# Patient Record
Sex: Female | Born: 1970 | Race: White | Hispanic: No | Marital: Married | Smoking: Never smoker
Health system: Southern US, Community
[De-identification: ages and names within clinical notes are randomized; demographics above are authoritative.]

## PROBLEM LIST (undated history)

## (undated) DIAGNOSIS — N809 Endometriosis, unspecified: Secondary | ICD-10-CM

## (undated) DIAGNOSIS — J349 Unspecified disorder of nose and nasal sinuses: Secondary | ICD-10-CM

## (undated) DIAGNOSIS — T7840XA Allergy, unspecified, initial encounter: Secondary | ICD-10-CM

## (undated) DIAGNOSIS — K219 Gastro-esophageal reflux disease without esophagitis: Secondary | ICD-10-CM

## (undated) DIAGNOSIS — I10 Essential (primary) hypertension: Secondary | ICD-10-CM

## (undated) HISTORY — PX: HAMMER TOE SURGERY: SHX385

## (undated) HISTORY — PX: GASTRECTOMY: SHX58

## (undated) HISTORY — DX: Essential (primary) hypertension: I10

## (undated) HISTORY — PX: TUBAL LIGATION: SHX77

## (undated) HISTORY — PX: ABLATION: SHX5711

## (undated) HISTORY — DX: Allergy, unspecified, initial encounter: T78.40XA

## (undated) HISTORY — PX: ORIF ANKLE FRACTURE: SHX5408

## (undated) HISTORY — PX: ANKLE FRACTURE SURGERY: SHX122

## (undated) HISTORY — PX: SINUS EXPLORATION: SHX5214

## (undated) HISTORY — PX: FOOT SURGERY: SHX648

---

## 1998-05-31 ENCOUNTER — Other Ambulatory Visit: Admission: RE | Admit: 1998-05-31 | Discharge: 1998-05-31 | Payer: Self-pay | Admitting: *Deleted

## 1999-01-24 ENCOUNTER — Inpatient Hospital Stay (HOSPITAL_COMMUNITY): Admission: AD | Admit: 1999-01-24 | Discharge: 1999-01-24 | Payer: Self-pay | Admitting: *Deleted

## 1999-02-02 ENCOUNTER — Inpatient Hospital Stay (HOSPITAL_COMMUNITY): Admission: AD | Admit: 1999-02-02 | Discharge: 1999-02-02 | Payer: Self-pay | Admitting: Obstetrics and Gynecology

## 1999-02-11 ENCOUNTER — Inpatient Hospital Stay (HOSPITAL_COMMUNITY): Admission: AD | Admit: 1999-02-11 | Discharge: 1999-02-13 | Payer: Self-pay | Admitting: Obstetrics & Gynecology

## 1999-03-19 ENCOUNTER — Other Ambulatory Visit: Admission: RE | Admit: 1999-03-19 | Discharge: 1999-03-19 | Payer: Self-pay | Admitting: *Deleted

## 2001-08-05 ENCOUNTER — Inpatient Hospital Stay (HOSPITAL_COMMUNITY): Admission: AD | Admit: 2001-08-05 | Discharge: 2001-08-05 | Payer: Self-pay | Admitting: Obstetrics and Gynecology

## 2001-08-12 ENCOUNTER — Inpatient Hospital Stay (HOSPITAL_COMMUNITY): Admission: AD | Admit: 2001-08-12 | Discharge: 2001-08-12 | Payer: Self-pay | Admitting: Obstetrics and Gynecology

## 2001-08-17 ENCOUNTER — Inpatient Hospital Stay (HOSPITAL_COMMUNITY): Admission: AD | Admit: 2001-08-17 | Discharge: 2001-08-17 | Payer: Self-pay | Admitting: Obstetrics & Gynecology

## 2001-08-17 ENCOUNTER — Encounter: Payer: Self-pay | Admitting: Obstetrics & Gynecology

## 2001-11-04 ENCOUNTER — Inpatient Hospital Stay (HOSPITAL_COMMUNITY): Admission: AD | Admit: 2001-11-04 | Discharge: 2001-11-07 | Payer: Self-pay | Admitting: *Deleted

## 2003-02-14 ENCOUNTER — Encounter: Admission: RE | Admit: 2003-02-14 | Discharge: 2003-02-28 | Payer: Self-pay | Admitting: Family Medicine

## 2003-09-08 ENCOUNTER — Encounter: Payer: Self-pay | Admitting: Emergency Medicine

## 2003-09-08 ENCOUNTER — Emergency Department (HOSPITAL_COMMUNITY): Admission: EM | Admit: 2003-09-08 | Discharge: 2003-09-08 | Payer: Self-pay | Admitting: Emergency Medicine

## 2004-01-02 ENCOUNTER — Other Ambulatory Visit: Admission: RE | Admit: 2004-01-02 | Discharge: 2004-01-02 | Payer: Self-pay | Admitting: *Deleted

## 2005-02-07 ENCOUNTER — Other Ambulatory Visit: Admission: RE | Admit: 2005-02-07 | Discharge: 2005-02-07 | Payer: Self-pay | Admitting: Obstetrics and Gynecology

## 2005-05-07 ENCOUNTER — Ambulatory Visit (HOSPITAL_COMMUNITY): Admission: RE | Admit: 2005-05-07 | Discharge: 2005-05-07 | Payer: Self-pay | Admitting: Obstetrics and Gynecology

## 2012-01-21 ENCOUNTER — Other Ambulatory Visit: Payer: Self-pay | Admitting: Otolaryngology

## 2012-01-21 DIAGNOSIS — J339 Nasal polyp, unspecified: Secondary | ICD-10-CM

## 2012-01-21 DIAGNOSIS — J328 Other chronic sinusitis: Secondary | ICD-10-CM

## 2012-01-23 ENCOUNTER — Other Ambulatory Visit: Payer: Self-pay

## 2012-01-27 ENCOUNTER — Ambulatory Visit
Admission: RE | Admit: 2012-01-27 | Discharge: 2012-01-27 | Disposition: A | Discharge status: Home / Self Care | Payer: BC Managed Care – PPO | Source: Ambulatory Visit | Attending: Otolaryngology | Admitting: Otolaryngology

## 2012-01-27 DIAGNOSIS — J328 Other chronic sinusitis: Secondary | ICD-10-CM

## 2012-01-27 DIAGNOSIS — J339 Nasal polyp, unspecified: Secondary | ICD-10-CM

## 2012-02-03 ENCOUNTER — Other Ambulatory Visit: Payer: Self-pay | Admitting: Otolaryngology

## 2012-10-19 ENCOUNTER — Encounter (HOSPITAL_COMMUNITY): Payer: Self-pay | Admitting: Emergency Medicine

## 2012-10-19 ENCOUNTER — Emergency Department (HOSPITAL_COMMUNITY)
Admission: EM | Admit: 2012-10-19 | Discharge: 2012-10-19 | Disposition: A | Discharge status: Home / Self Care | Payer: BC Managed Care – PPO | Attending: Emergency Medicine | Admitting: Emergency Medicine

## 2012-10-19 ENCOUNTER — Emergency Department (HOSPITAL_COMMUNITY): Payer: BC Managed Care – PPO

## 2012-10-19 DIAGNOSIS — Z3202 Encounter for pregnancy test, result negative: Secondary | ICD-10-CM | POA: Insufficient documentation

## 2012-10-19 DIAGNOSIS — Z8669 Personal history of other diseases of the nervous system and sense organs: Secondary | ICD-10-CM | POA: Insufficient documentation

## 2012-10-19 DIAGNOSIS — R319 Hematuria, unspecified: Secondary | ICD-10-CM | POA: Insufficient documentation

## 2012-10-19 DIAGNOSIS — N898 Other specified noninflammatory disorders of vagina: Secondary | ICD-10-CM | POA: Insufficient documentation

## 2012-10-19 DIAGNOSIS — R6883 Chills (without fever): Secondary | ICD-10-CM | POA: Insufficient documentation

## 2012-10-19 DIAGNOSIS — K5732 Diverticulitis of large intestine without perforation or abscess without bleeding: Secondary | ICD-10-CM

## 2012-10-19 DIAGNOSIS — Z79899 Other long term (current) drug therapy: Secondary | ICD-10-CM | POA: Insufficient documentation

## 2012-10-19 HISTORY — DX: Unspecified disorder of nose and nasal sinuses: J34.9

## 2012-10-19 HISTORY — DX: Endometriosis, unspecified: N80.9

## 2012-10-19 LAB — URINE MICROSCOPIC-ADD ON

## 2012-10-19 LAB — CBC WITH DIFFERENTIAL/PLATELET
Basophils Absolute: 0 10*3/uL (ref 0.0–0.1)
Basophils Relative: 0 % (ref 0–1)
HCT: 39.2 % (ref 36.0–46.0)
Lymphocytes Relative: 19 % (ref 12–46)
MCHC: 35.5 g/dL (ref 30.0–36.0)
Monocytes Absolute: 1 10*3/uL (ref 0.1–1.0)
Neutro Abs: 9.2 10*3/uL — ABNORMAL HIGH (ref 1.7–7.7)
Neutrophils Relative %: 69 % (ref 43–77)
Platelets: 288 10*3/uL (ref 150–400)
RDW: 13.5 % (ref 11.5–15.5)
WBC: 13.3 10*3/uL — ABNORMAL HIGH (ref 4.0–10.5)

## 2012-10-19 LAB — COMPREHENSIVE METABOLIC PANEL
ALT: 25 U/L (ref 0–35)
AST: 19 U/L (ref 0–37)
Albumin: 3.9 g/dL (ref 3.5–5.2)
CO2: 24 mEq/L (ref 19–32)
Chloride: 101 mEq/L (ref 96–112)
Creatinine, Ser: 0.77 mg/dL (ref 0.50–1.10)
Sodium: 136 mEq/L (ref 135–145)
Total Bilirubin: 1.3 mg/dL — ABNORMAL HIGH (ref 0.3–1.2)

## 2012-10-19 LAB — URINALYSIS, ROUTINE W REFLEX MICROSCOPIC
Hgb urine dipstick: NEGATIVE
Nitrite: NEGATIVE
Protein, ur: 30 mg/dL — AB
Specific Gravity, Urine: 1.023 (ref 1.005–1.030)
Urobilinogen, UA: 0.2 mg/dL (ref 0.0–1.0)

## 2012-10-19 LAB — PREGNANCY, URINE: Preg Test, Ur: NEGATIVE

## 2012-10-19 LAB — WET PREP, GENITAL: Trich, Wet Prep: NONE SEEN

## 2012-10-19 MED ORDER — MORPHINE SULFATE 4 MG/ML IJ SOLN
4.0000 mg | Freq: Once | INTRAMUSCULAR | Status: AC
  Administered 2012-10-19: 4 mg via INTRAVENOUS
  Filled 2012-10-19: qty 1

## 2012-10-19 MED ORDER — IOHEXOL 300 MG/ML  SOLN: 80.0000 mL | Freq: Once | INTRAMUSCULAR | Status: DC | PRN

## 2012-10-19 MED ORDER — ONDANSETRON HCL 4 MG/2ML IJ SOLN
4.0000 mg | Freq: Once | INTRAMUSCULAR | Status: AC
  Administered 2012-10-19: 4 mg via INTRAVENOUS
  Filled 2012-10-19: qty 2

## 2012-10-19 MED ORDER — ONDANSETRON 8 MG PO TBDP: 8.0000 mg | ORAL_TABLET | Freq: Three times a day (TID) | ORAL | Status: DC | PRN

## 2012-10-19 MED ORDER — CIPROFLOXACIN HCL 500 MG PO TABS: 500.0000 mg | ORAL_TABLET | Freq: Two times a day (BID) | ORAL | Status: DC

## 2012-10-19 MED ORDER — OXYCODONE-ACETAMINOPHEN 5-325 MG PO TABS
1.0000 | ORAL_TABLET | Freq: Once | ORAL | Status: AC
  Administered 2012-10-19: 1 via ORAL
  Filled 2012-10-19: qty 1

## 2012-10-19 MED ORDER — MORPHINE SULFATE 4 MG/ML IJ SOLN
6.0000 mg | Freq: Once | INTRAMUSCULAR | Status: AC
  Administered 2012-10-19: 6 mg via INTRAVENOUS
  Filled 2012-10-19: qty 2

## 2012-10-19 MED ORDER — METRONIDAZOLE 500 MG PO TABS: 500.0000 mg | ORAL_TABLET | Freq: Three times a day (TID) | ORAL | Status: DC

## 2012-10-19 MED ORDER — IOHEXOL 300 MG/ML  SOLN
20.0000 mL | INTRAMUSCULAR | Status: AC
  Administered 2012-10-19 (×2): 20 mL via ORAL

## 2012-10-19 MED ORDER — HYDROCODONE-ACETAMINOPHEN 5-500 MG PO TABS: 1.0000 | ORAL_TABLET | Freq: Four times a day (QID) | ORAL | Status: DC | PRN

## 2012-10-19 NOTE — ED Notes (Signed)
Ptt finished drinking contrast, CT notified.

## 2012-10-19 NOTE — ED Notes (Signed)
Lower abd pain hurts to touch it since yesterday denies dysuria no n/v d

## 2012-10-19 NOTE — ED Notes (Signed)
Pt c/o lower abd pain that started yesterday and has gotten progressively worse. Pt denies dysuria. Pt reports the pain feels like contractions. Pt LMP 10/02/12.

## 2012-10-19 NOTE — ED Provider Notes (Signed)
History     CSN: [id number]  Arrival date & time 10/19/12  1149   First MD Initiated Contact with Patient 10/19/12 1158      No chief complaint on file.   (Consider location/radiation/quality/duration/timing/severity/associated sxs/prior treatment) HPI Elizabeth Waller is a 41 y.o. female who presents with lower abdominal pain. States pain started last night. States constant, sharp. States no nausea, vomiting, urinary symptoms, diarrhea. Last normal bowel movement yesterday. States urine is red in color. Denies back or flank pain. Denies fever, states had chills last night. No hx of the same. Denies taking any medications prior to arrival.    Past Medical History  Diagnosis Date  . Sinus trouble     No past surgical history on file.  No family history on file.  History  Substance Use Topics  . Smoking status: Never Smoker   . Smokeless tobacco: Not on file  . Alcohol Use: No    OB History    Grav Para Term Preterm Abortions TAB SAB Ect Mult Living                  Review of Systems  Constitutional: Positive for chills. Negative for fever.  Respiratory: Negative.   Cardiovascular: Negative.   Gastrointestinal: Positive for abdominal pain. Negative for nausea, vomiting, diarrhea, constipation and rectal pain.  Genitourinary: Positive for hematuria. Negative for dysuria, urgency, vaginal bleeding, vaginal discharge, difficulty urinating and menstrual problem.  Neurological: Negative for dizziness, weakness and headaches.  All other systems reviewed and are negative.    Allergies  Review of patient's allergies indicates no known allergies.  Home Medications   Current Outpatient Rx  Name  Route  Sig  Dispense  Refill  . ATENOLOL 100 MG PO TABS   Oral   Take 100 mg by mouth daily.         Marland Kitchen OVER THE COUNTER MEDICATION   Oral   Take 1 tablet by mouth daily. Takes [employer] brand of allergy medication         . TRIAMTERENE-HCTZ 37.5-25 MG PO TABS   Oral  Take 1 tablet by mouth daily.           BP 129/61  Pulse 97  Temp 98.2 F (36.8 C) (Oral)  Resp 18  SpO2 99%  Physical Exam  Nursing note and vitals reviewed. Constitutional: She appears well-developed and well-nourished. No distress.  Neck: Neck supple.  Cardiovascular: Normal rate, regular rhythm and normal heart sounds.   Pulmonary/Chest: Effort normal and breath sounds normal. No respiratory distress. She has no wheezes. She has no rales.  Abdominal: Soft. Bowel sounds are normal. She exhibits no distension. There is tenderness. There is no rebound.       Diffuse tenderness, worse in the suprapubic area. No guarding. No CVA tendereness  Genitourinary:       Normal external genitalia. White vaginal discharge. No CMT. Suprapubic tenderness. No adnexal tenderness  Musculoskeletal: She exhibits no edema.  Neurological: She is alert.  Skin: Skin is warm and dry.  Psychiatric: She has a normal mood and affect. Her behavior is normal.    ED Course  Procedures (including critical care time)  PT with lower abdominal pain. No hx of the same. Will get labs, urine.   Results for orders placed during the hospital encounter of 10/19/12  URINALYSIS, ROUTINE W REFLEX MICROSCOPIC      Component Value Range   Color, Urine AMBER (*) YELLOW   APPearance CLOUDY (*) CLEAR   Specific  Gravity, Urine 1.023  1.005 - 1.030   pH 6.0  5.0 - 8.0   Glucose, UA NEGATIVE  NEGATIVE mg/dL   Hgb urine dipstick NEGATIVE  NEGATIVE   Bilirubin Urine SMALL (*) NEGATIVE   Ketones, ur 15 (*) NEGATIVE mg/dL   Protein, ur 30 (*) NEGATIVE mg/dL   Urobilinogen, UA 0.2  0.0 - 1.0 mg/dL   Nitrite NEGATIVE  NEGATIVE   Leukocytes, UA SMALL (*) NEGATIVE  PREGNANCY, URINE      Component Value Range   Preg Test, Ur NEGATIVE  NEGATIVE  CBC WITH DIFFERENTIAL      Component Value Range   WBC 13.3 (*) 4.0 - 10.5 K/uL   RBC 4.66  3.87 - 5.11 MIL/uL   Hemoglobin 13.9  12.0 - 15.0 g/dL   HCT 30.8  65.7 - 84.6 %    MCV 84.1  78.0 - 100.0 fL   MCH 29.8  26.0 - 34.0 pg   MCHC 35.5  30.0 - 36.0 g/dL   RDW 96.2  95.2 - 84.1 %   Platelets 288  150 - 400 K/uL   Neutrophils Relative 69  43 - 77 %   Neutro Abs 9.2 (*) 1.7 - 7.7 K/uL   Lymphocytes Relative 19  12 - 46 %   Lymphs Abs 2.5  0.7 - 4.0 K/uL   Monocytes Relative 7  3 - 12 %   Monocytes Absolute 1.0  0.1 - 1.0 K/uL   Eosinophils Relative 5  0 - 5 %   Eosinophils Absolute 0.6  0.0 - 0.7 K/uL   Basophils Relative 0  0 - 1 %   Basophils Absolute 0.0  0.0 - 0.1 K/uL  COMPREHENSIVE METABOLIC PANEL      Component Value Range   Sodium 136  135 - 145 mEq/L   Potassium 3.2 (*) 3.5 - 5.1 mEq/L   Chloride 101  96 - 112 mEq/L   CO2 24  19 - 32 mEq/L   Glucose, Bld 104 (*) 70 - 99 mg/dL   BUN 11  6 - 23 mg/dL   Creatinine, Ser 3.24  0.50 - 1.10 mg/dL   Calcium 9.2  8.4 - 40.1 mg/dL   Total Protein 7.2  6.0 - 8.3 g/dL   Albumin 3.9  3.5 - 5.2 g/dL   AST 19  0 - 37 U/L   ALT 25  0 - 35 U/L   Alkaline Phosphatase 52  39 - 117 U/L   Total Bilirubin 1.3 (*) 0.3 - 1.2 mg/dL   GFR calc non Af Amer >90  >90 mL/min   GFR calc Af Amer >90  >90 mL/min  LIPASE, BLOOD      Component Value Range   Lipase 22  11 - 59 U/L  URINE MICROSCOPIC-ADD ON      Component Value Range   Squamous Epithelial / LPF FEW (*) RARE   WBC, UA 3-6  <3 WBC/hpf   RBC / HPF 0-2  <3 RBC/hpf   Bacteria, UA RARE  RARE    Will get CT, suspect possible colitis/diverticulitis, elevated WBC, tenderness.   Ct Abdomen Pelvis W Contrast  10/19/2012  *RADIOLOGY REPORT*  Clinical Data: Low abdominal pain.  History of tubal ligation.  CT ABDOMEN AND PELVIS WITH CONTRAST  Technique:  Multidetector CT imaging of the abdomen and pelvis was performed following the standard protocol during bolus administration of intravenous contrast.  Contrast:  80 ml Omnipaque-300 intravenously.  Comparison: Report from abdominal ultrasound 08/17/2001.  Findings:  There are several large peripherally calcified  gallstones within the gallbladder lumen which contain central nitrogen.  There is no gallbladder wall thickening or biliary dilatation.  There is hepatic low density consistent with steatosis.  No focal abnormalities of the liver or pancreas are seen.  The spleen, adrenal glands and kidneys appear unremarkable.  There is sigmoid colon wall thickening with mild inflammatory change in the adjacent fat.  There are sigmoid colon diverticular changes.  There is no focal extraluminal fluid or air collection. A small amount of free pelvic fluid is within physiologic limits. The appendix appears normal.  There is a small exophytic uterine fundal fibroid.  No adnexal masses are seen.  The urinary bladder appears normal.  There are no acute or significant osseous findings.  IMPRESSION:  1.  Findings are most consistent with mild sigmoid diverticulitis. No evidence of perforation or abscess.  Clinical follow-up recommended. 2.  No evidence of appendicitis or bowel obstruction. 3.  Cholelithiasis without evidence of cholecystitis or biliary dilatation. 4.  Mild hepatic steatosis. 5.  Small uterine fundal fibroid.   Original Report Authenticated By: Carey Bullocks, M.D.       1. Sigmoid diverticulitis       MDM  Pt with lower abdominal pain for two days, nausea. Labs show elevated wbc of 13.3, otherwise unremarkable. Pain only in lower abdomen. UA negative for infection, small bili noted.  Lipase, LFTs negative, mildly elevated bili of 1.3. CT obtained which showed mild sigmoid diverticulitis with no evidence of perforation of abscess. Pt is non toxic, afebrile, vs normal. Not vomiting. Suspect pt is appropriate for outpatient treatment. Will stat on antibiotics, cipro and flaglyl, pain meds, follow up closely with primary are doctor.         Lottie Mussel, [place] 10/20/12 [phone]

## 2012-10-20 LAB — GC/CHLAMYDIA PROBE AMP
CT Probe RNA: NEGATIVE
GC Probe RNA: NEGATIVE

## 2012-10-20 NOTE — ED Provider Notes (Signed)
Medical screening examination/treatment/procedure(s) were performed by non-physician practitioner and as supervising physician I was immediately available for consultation/collaboration.   Elizabeth Waller. Oletta Lamas, MD 10/20/12 1610

## 2013-03-29 ENCOUNTER — Other Ambulatory Visit: Payer: Self-pay | Admitting: Obstetrics & Gynecology

## 2013-09-20 IMAGING — CT CT ABD-PELV W/ CM
2 of 5 series · 16 of 46 positions shown, 18 images · IV contrast (agent unspecified)
Comparison: Report from abdominal ultrasound 08/17/2001.

CLINICAL DATA: Low abdominal pain.  History of tubal ligation.

CT ABDOMEN AND PELVIS WITH CONTRAST
TECHNIQUE: Multidetector CT imaging of the abdomen and pelvis was
performed following the standard protocol during bolus
administration of intravenous contrast.
Contrast:  80 ml Emnipaque-YAA intravenously.

[Series 2: abd/pelv with 5.0 b31f st · axial · 0.81mm/px · z∈[-525,-115]mm · 13 of 92 slices shown, 15 images]
[im 5/92  soft-tissue]
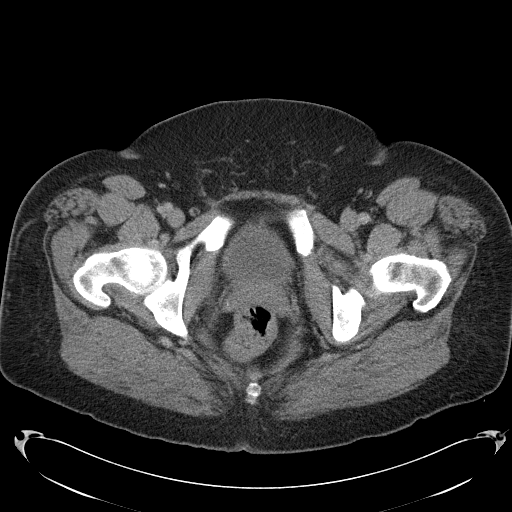
[im 5/92  bone]
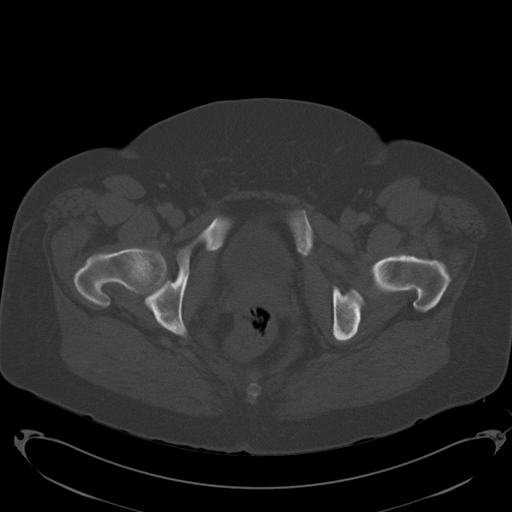
[im 15/92  soft-tissue]
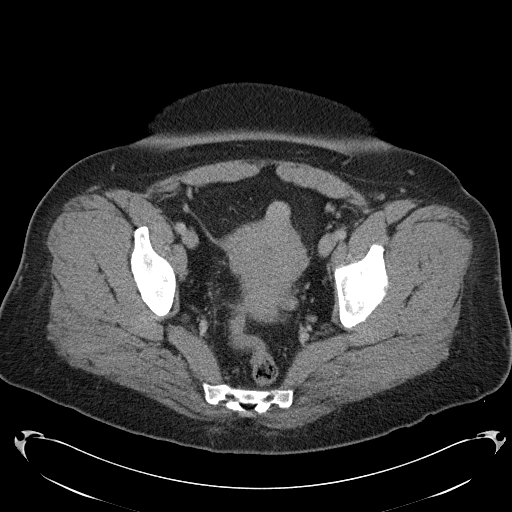
[im 20/92  soft-tissue]
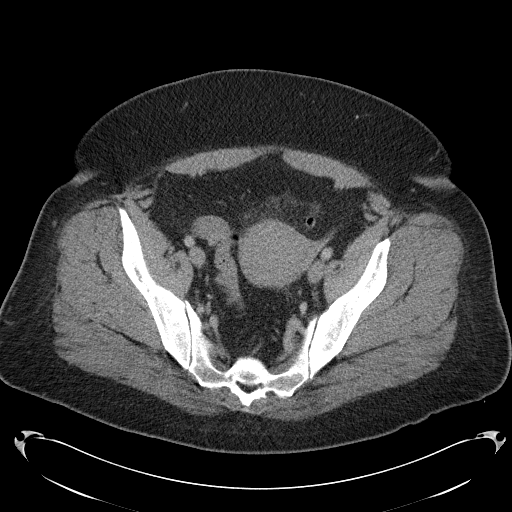
[im 24/92  soft-tissue]
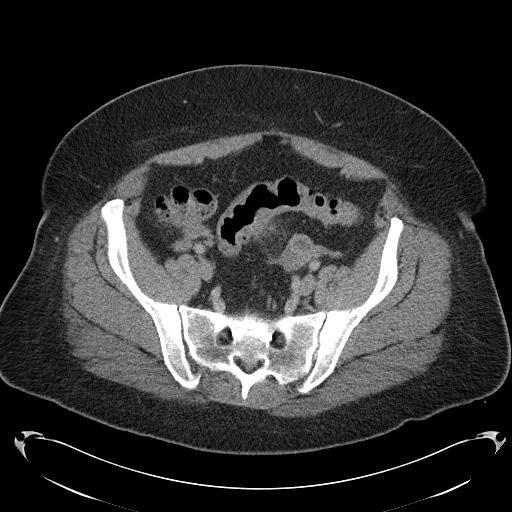
[im 34/92  soft-tissue]
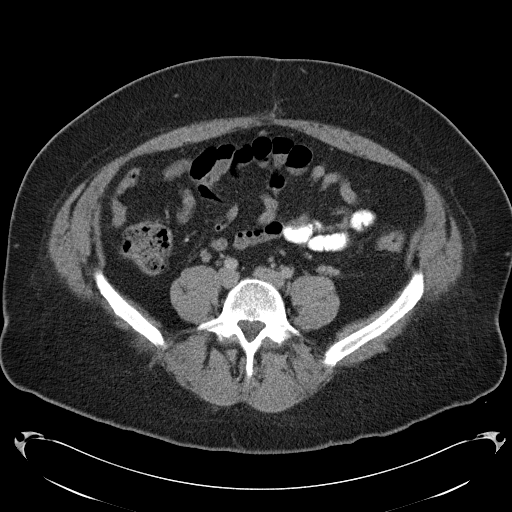
[im 39/92  soft-tissue]
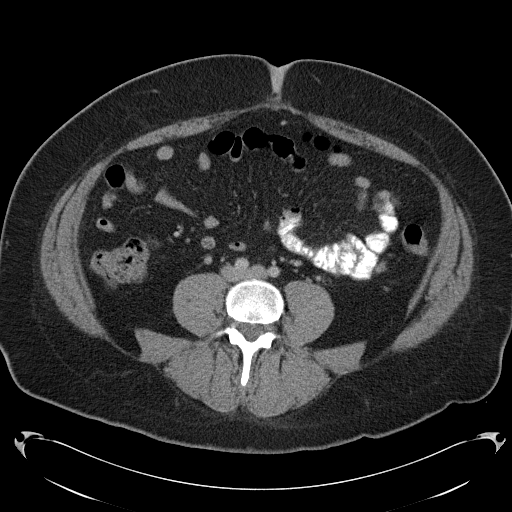
[im 48/92  soft-tissue]
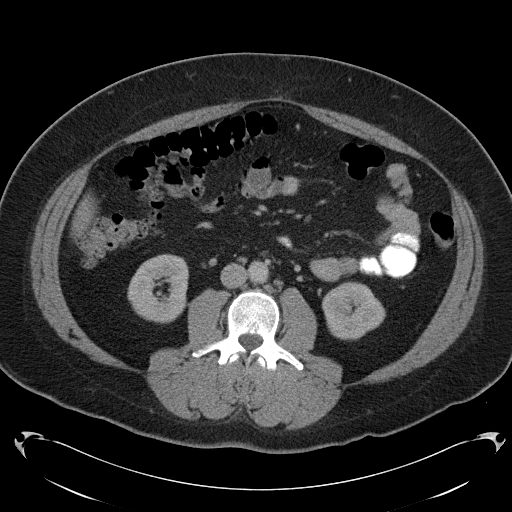
[im 53/92  soft-tissue]
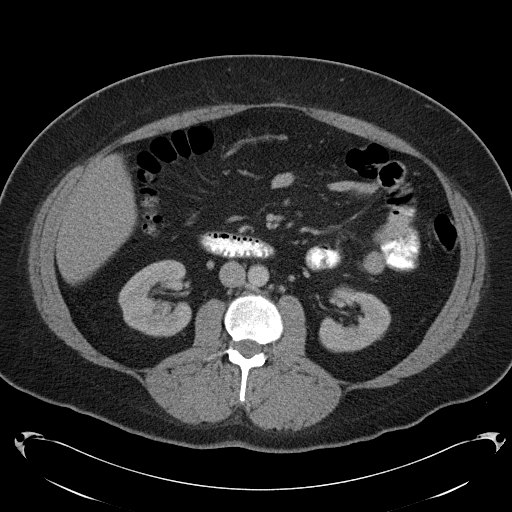
[im 58/92  soft-tissue]
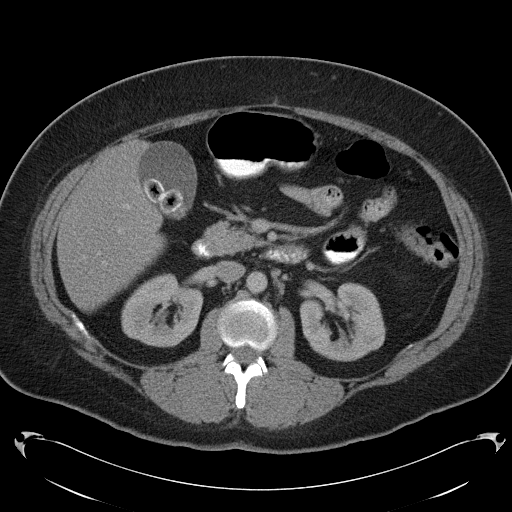
[im 58/92  bone]
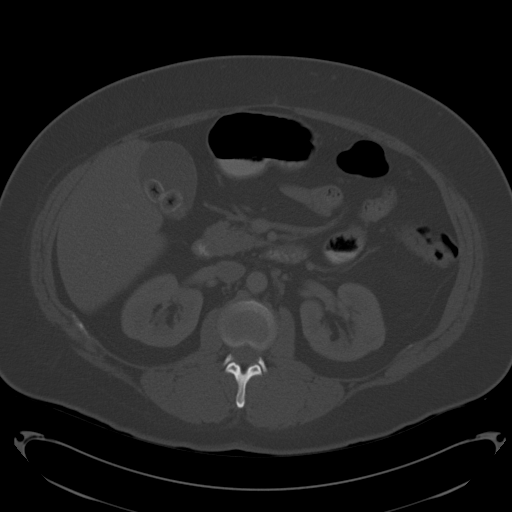
[im 68/92  soft-tissue]
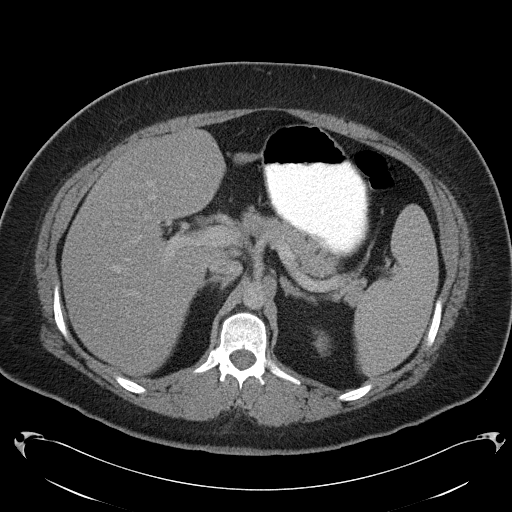
[im 72/92  soft-tissue]
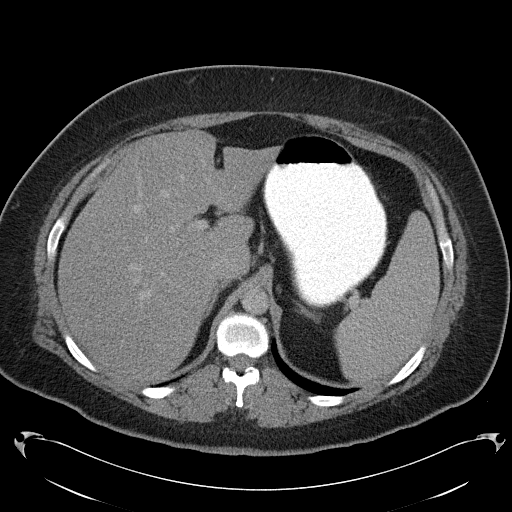
[im 77/92  soft-tissue]
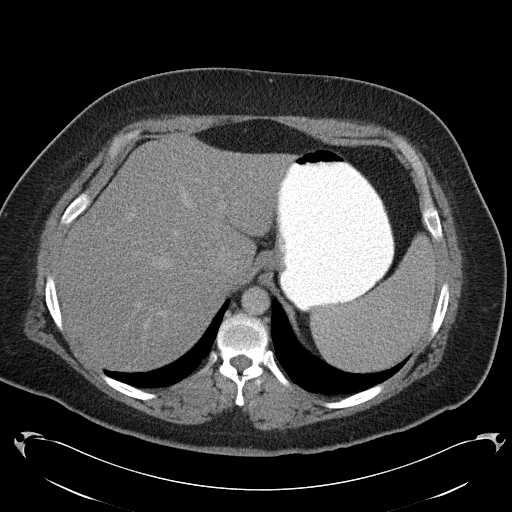
[im 87/92  soft-tissue]
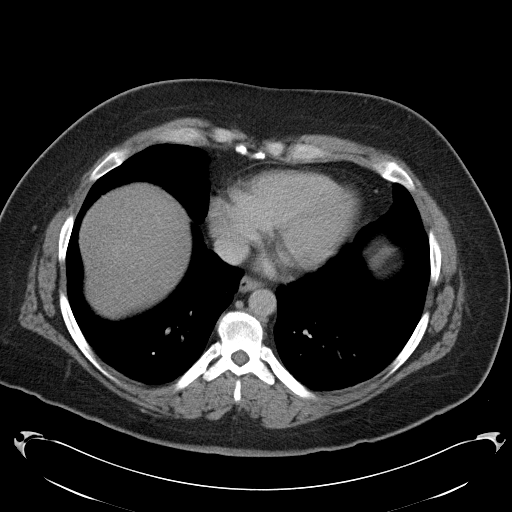

[Series 5: coronals · coronal · 0.81mm/px · 3 of 135 slices shown]
[im 45/135  soft-tissue]
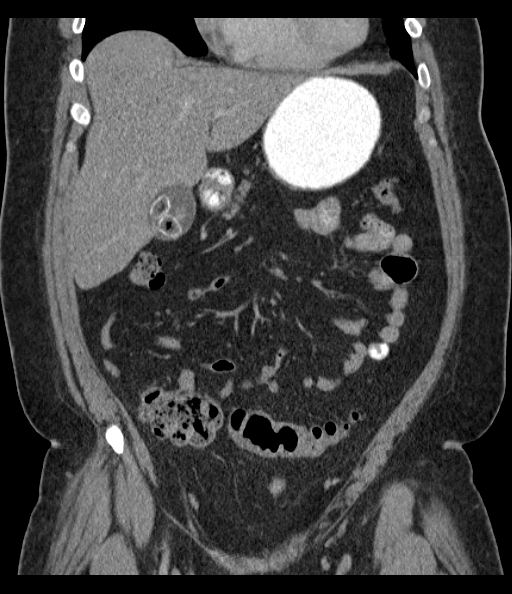
[im 60/135  soft-tissue]
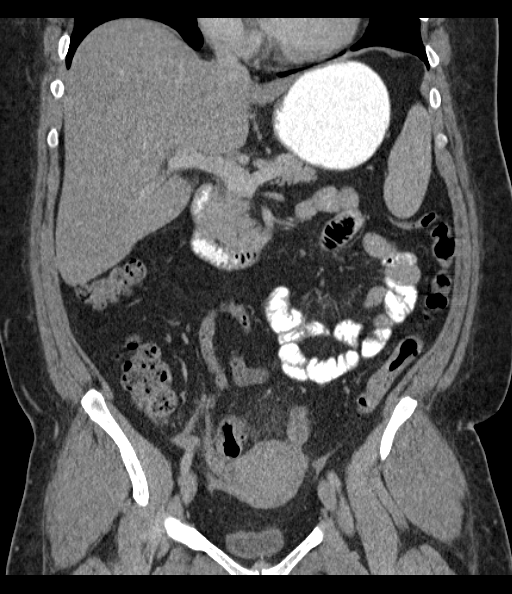
[im 75/135  soft-tissue]
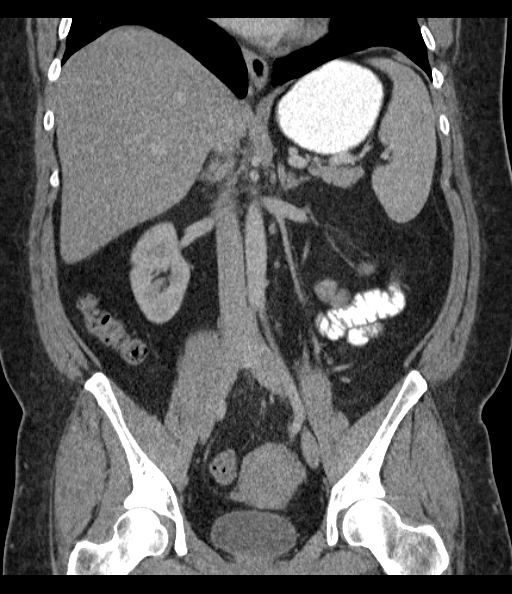

[16 of 46 positions shown; findings below may reference images not displayed]

FINDINGS: There are several large peripherally calcified gallstones
within the gallbladder lumen which contain central nitrogen.  There
is no gallbladder wall thickening or biliary dilatation.  There is
hepatic low density consistent with steatosis.  No focal
abnormalities of the liver or pancreas are seen.

The spleen, adrenal glands and kidneys appear unremarkable.

There is sigmoid colon wall thickening with mild inflammatory
change in the adjacent fat.  There are sigmoid colon diverticular
changes.  There is no focal extraluminal fluid or air collection. A
small amount of free pelvic fluid is within physiologic limits.
The appendix appears normal.  There is a small exophytic uterine
fundal fibroid.  No adnexal masses are seen.  The urinary bladder
appears normal.

There are no acute or significant osseous findings.
IMPRESSION: 1.  Findings are most consistent with mild sigmoid diverticulitis.
No evidence of perforation or abscess.  Clinical follow-up
recommended.
2.  No evidence of appendicitis or bowel obstruction.
3.  Cholelithiasis without evidence of cholecystitis or biliary
dilatation.
4.  Mild hepatic steatosis.
5.  Small uterine fundal fibroid.

## 2015-03-26 ENCOUNTER — Other Ambulatory Visit: Payer: Self-pay | Admitting: Obstetrics & Gynecology

## 2015-03-27 LAB — CYTOLOGY - PAP

## 2015-11-01 DIAGNOSIS — R0981 Nasal congestion: Secondary | ICD-10-CM | POA: Insufficient documentation

## 2015-11-01 DIAGNOSIS — J0101 Acute recurrent maxillary sinusitis: Secondary | ICD-10-CM | POA: Insufficient documentation

## 2015-11-01 DIAGNOSIS — J320 Chronic maxillary sinusitis: Secondary | ICD-10-CM | POA: Insufficient documentation

## 2015-11-01 DIAGNOSIS — R040 Epistaxis: Secondary | ICD-10-CM | POA: Insufficient documentation

## 2015-11-01 DIAGNOSIS — J339 Nasal polyp, unspecified: Secondary | ICD-10-CM | POA: Insufficient documentation

## 2015-12-17 DIAGNOSIS — E785 Hyperlipidemia, unspecified: Secondary | ICD-10-CM | POA: Insufficient documentation

## 2015-12-17 DIAGNOSIS — J342 Deviated nasal septum: Secondary | ICD-10-CM | POA: Insufficient documentation

## 2015-12-17 DIAGNOSIS — M545 Low back pain, unspecified: Secondary | ICD-10-CM | POA: Insufficient documentation

## 2015-12-17 DIAGNOSIS — J343 Hypertrophy of nasal turbinates: Secondary | ICD-10-CM | POA: Insufficient documentation

## 2015-12-17 DIAGNOSIS — I10 Essential (primary) hypertension: Secondary | ICD-10-CM | POA: Insufficient documentation

## 2015-12-17 DIAGNOSIS — E876 Hypokalemia: Secondary | ICD-10-CM | POA: Insufficient documentation

## 2015-12-17 DIAGNOSIS — I341 Nonrheumatic mitral (valve) prolapse: Secondary | ICD-10-CM | POA: Insufficient documentation

## 2016-01-24 DIAGNOSIS — E669 Obesity, unspecified: Secondary | ICD-10-CM | POA: Insufficient documentation

## 2016-02-04 DIAGNOSIS — Z0000 Encounter for general adult medical examination without abnormal findings: Secondary | ICD-10-CM | POA: Insufficient documentation

## 2016-04-30 DIAGNOSIS — Z903 Acquired absence of stomach [part of]: Secondary | ICD-10-CM | POA: Insufficient documentation

## 2016-09-25 ENCOUNTER — Emergency Department (HOSPITAL_COMMUNITY)
Admission: EM | Admit: 2016-09-25 | Discharge: 2016-09-25 | Disposition: A | Discharge status: Other Acute Inpatient Hospital | Payer: BLUE CROSS/BLUE SHIELD | Attending: Emergency Medicine | Admitting: Emergency Medicine

## 2016-09-25 ENCOUNTER — Encounter (HOSPITAL_COMMUNITY): Payer: Self-pay | Admitting: Emergency Medicine

## 2016-09-25 ENCOUNTER — Inpatient Hospital Stay (HOSPITAL_COMMUNITY)
Admission: AD | Admit: 2016-09-25 | Discharge: 2016-09-28 | DRG: 882 | Disposition: A | Discharge status: Home / Self Care | Payer: BLUE CROSS/BLUE SHIELD | Source: Intra-hospital | Attending: Psychiatry | Admitting: Psychiatry

## 2016-09-25 DIAGNOSIS — F329 Major depressive disorder, single episode, unspecified: Secondary | ICD-10-CM | POA: Insufficient documentation

## 2016-09-25 DIAGNOSIS — F419 Anxiety disorder, unspecified: Secondary | ICD-10-CM | POA: Diagnosis present

## 2016-09-25 DIAGNOSIS — F4323 Adjustment disorder with mixed anxiety and depressed mood: Secondary | ICD-10-CM | POA: Clinically undetermined

## 2016-09-25 DIAGNOSIS — Z79899 Other long term (current) drug therapy: Secondary | ICD-10-CM | POA: Insufficient documentation

## 2016-09-25 DIAGNOSIS — Z9884 Bariatric surgery status: Secondary | ICD-10-CM | POA: Diagnosis not present

## 2016-09-25 DIAGNOSIS — F322 Major depressive disorder, single episode, severe without psychotic features: Secondary | ICD-10-CM | POA: Diagnosis present

## 2016-09-25 DIAGNOSIS — K219 Gastro-esophageal reflux disease without esophagitis: Secondary | ICD-10-CM | POA: Diagnosis present

## 2016-09-25 DIAGNOSIS — Z9889 Other specified postprocedural states: Secondary | ICD-10-CM | POA: Diagnosis not present

## 2016-09-25 DIAGNOSIS — R45851 Suicidal ideations: Secondary | ICD-10-CM | POA: Diagnosis present

## 2016-09-25 DIAGNOSIS — Z23 Encounter for immunization: Secondary | ICD-10-CM

## 2016-09-25 HISTORY — DX: Gastro-esophageal reflux disease without esophagitis: K21.9

## 2016-09-25 LAB — RAPID URINE DRUG SCREEN, HOSP PERFORMED
AMPHETAMINES: NOT DETECTED
BENZODIAZEPINES: NOT DETECTED
Barbiturates: NOT DETECTED
Cocaine: NOT DETECTED
OPIATES: NOT DETECTED
TETRAHYDROCANNABINOL: NOT DETECTED

## 2016-09-25 LAB — COMPREHENSIVE METABOLIC PANEL
ALBUMIN: 4.3 g/dL (ref 3.5–5.0)
ALK PHOS: 45 U/L (ref 38–126)
ALT: 15 U/L (ref 14–54)
AST: 16 U/L (ref 15–41)
Anion gap: 8 (ref 5–15)
BUN: 12 mg/dL (ref 6–20)
CHLORIDE: 105 mmol/L (ref 101–111)
CO2: 27 mmol/L (ref 22–32)
CREATININE: 0.74 mg/dL (ref 0.44–1.00)
Calcium: 9.4 mg/dL (ref 8.9–10.3)
Glucose, Bld: 96 mg/dL (ref 65–99)
POTASSIUM: 3.8 mmol/L (ref 3.5–5.1)
Sodium: 140 mmol/L (ref 135–145)
Total Bilirubin: 1 mg/dL (ref 0.3–1.2)
Total Protein: 6.7 g/dL (ref 6.5–8.1)

## 2016-09-25 LAB — CBC
HCT: 39.8 % (ref 36.0–46.0)
HEMOGLOBIN: 13.5 g/dL (ref 12.0–15.0)
MCH: 29 pg (ref 26.0–34.0)
MCHC: 33.9 g/dL (ref 30.0–36.0)
MCV: 85.6 fL (ref 78.0–100.0)
PLATELETS: 311 10*3/uL (ref 150–400)
RBC: 4.65 MIL/uL (ref 3.87–5.11)
RDW: 13.2 % (ref 11.5–15.5)
WBC: 7.2 10*3/uL (ref 4.0–10.5)

## 2016-09-25 LAB — ACETAMINOPHEN LEVEL: Acetaminophen (Tylenol), Serum: <10 ug/mL — ABNORMAL LOW (ref 10–30)

## 2016-09-25 LAB — PREGNANCY, URINE: Preg Test, Ur: NEGATIVE

## 2016-09-25 LAB — ETHANOL

## 2016-09-25 LAB — SALICYLATE LEVEL

## 2016-09-25 MED ORDER — ACETAMINOPHEN 325 MG PO TABS
650.0000 mg | ORAL_TABLET | Freq: Four times a day (QID) | ORAL | Status: DC | PRN
  Administered 2016-09-25: 650 mg via ORAL
  Filled 2016-09-25: qty 2

## 2016-09-25 MED ORDER — ALUM & MAG HYDROXIDE-SIMETH 200-200-20 MG/5ML PO SUSP: 30.0000 mL | ORAL | Status: DC | PRN

## 2016-09-25 MED ORDER — MAGNESIUM HYDROXIDE 400 MG/5ML PO SUSP: 30.0000 mL | Freq: Every day | ORAL | Status: DC | PRN

## 2016-09-25 MED ORDER — ACETAMINOPHEN 325 MG PO TABS: 650.0000 mg | ORAL_TABLET | Freq: Four times a day (QID) | ORAL | Status: DC | PRN

## 2016-09-25 MED ORDER — HYDROXYZINE HCL 25 MG PO TABS: 25.0000 mg | ORAL_TABLET | Freq: Four times a day (QID) | ORAL | Status: DC | PRN

## 2016-09-25 MED ORDER — TRAZODONE HCL 50 MG PO TABS: 50.0000 mg | ORAL_TABLET | Freq: Every evening | ORAL | Status: DC | PRN

## 2016-09-25 NOTE — ED Notes (Signed)
Prior to signing consent form. Pt request if she no longer wants to go voluntarily will she be allowed to leave. RN informed the pt that she was unaware and would have to go talk to the doctor. Pt did not ask any follow up questions. Pt signed request on her on free will.

## 2016-09-25 NOTE — ED Notes (Signed)
Pt's daughter took all of pt's belongings home with her except glasses.   Pt moved to POD C room 21. Pt signed Welcome to POD C sheet given to tech and sitter at transport.

## 2016-09-25 NOTE — ED Provider Notes (Signed)
[facility]-EMERGENCY DEPT Provider Note   CSN: [id number] Arrival date & time: 09/25/16  1328     History   Chief Complaint Chief Complaint  Patient presents with  . Suicidal    HPI Elizabeth Waller is a 45 y.o. female.  Patient presents to the ED with a chief complaint of suicidal ideation.  She is brought in voluntarily by her daughter.  Patient states that she has many life stressors that have led her to feel this way.  She states that she wants to jump off the parking structure at her job and would want to overdose on medication at home.  She states that she is being bullied by her supervisor.  She states that if she is going to leave this life she might "just take my family with me."  She denies taking any medications today apart from her regular home meds.  She denies any drug or alcohol use.  She denies any physical pain or symptoms with the exception of mild headache.   The history is provided by the patient. No language interpreter was used.    Past Medical History:  Diagnosis Date  . Endometriosis   . Sinus trouble     There are no active problems to display for this patient.   Past Surgical History:  Procedure Laterality Date  . ABLATION     for scar tissue and endometriosis  . ANKLE FRACTURE SURGERY    . CESAREAN SECTION    . GASTRECTOMY    . SINUS EXPLORATION      OB History    No data available       Home Medications    Prior to Admission medications   Medication Sig Start Date End Date Taking? Authorizing Provider  atenolol (TENORMIN) 100 MG tablet Take 100 mg by mouth daily.    Historical Provider, MD  ciprofloxacin (CIPRO) 500 MG tablet Take 1 tablet (500 mg total) by mouth 2 (two) times daily. 10/19/12   Tatyana Kirichenko, PA-C  HYDROcodone-acetaminophen (VICODIN) 5-500 MG per tablet Take 1-2 tablets by mouth every 6 (six) hours as needed for pain. 10/19/12   Tatyana Kirichenko, PA-C  metroNIDAZOLE (FLAGYL) 500 MG tablet Take 1 tablet (500 mg  total) by mouth 3 (three) times daily. 10/19/12   Tatyana Kirichenko, PA-C  ondansetron (ZOFRAN ODT) 8 MG disintegrating tablet Take 1 tablet (8 mg total) by mouth every 8 (eight) hours as needed for nausea. 10/19/12   Tatyana Kirichenko, PA-C  OVER THE COUNTER MEDICATION Take 1 tablet by mouth daily. Takes [employer] brand of allergy medication    Historical Provider, MD  triamterene-hydrochlorothiazide (MAXZIDE-25) 37.5-25 MG per tablet Take 1 tablet by mouth daily.    Historical Provider, MD    Family History History reviewed. No pertinent family history.  Social History Social History  Substance Use Topics  . Smoking status: Never Smoker  . Smokeless tobacco: Not on file  . Alcohol use No     Allergies   Review of patient's allergies indicates no known allergies.   Review of Systems Review of Systems  Psychiatric/Behavioral: Positive for suicidal ideas.       Homicidal thoughts  All other systems reviewed and are negative.    Physical Exam Updated Vital Signs BP 143/95 (BP Location: Right Arm)   Pulse 72   Temp 97.9 F (36.6 C) (Oral)   Resp 19   Ht 5\' 7"  (1.702 m)   Wt 79.4 kg   SpO2 96%   BMI 27.41 kg/m  Physical Exam  Constitutional: She is oriented to person, place, and time. She appears well-developed and well-nourished.  HENT:  Head: Normocephalic and atraumatic.  Eyes: Conjunctivae and EOM are normal. Pupils are equal, round, and reactive to light.  Neck: Normal range of motion. Neck supple.  Cardiovascular: Normal rate and regular rhythm.  Exam reveals no gallop and no friction rub.   No murmur heard. Pulmonary/Chest: Effort normal and breath sounds normal. No respiratory distress. She has no wheezes. She has no rales. She exhibits no tenderness.  Abdominal: Soft. Bowel sounds are normal. She exhibits no distension and no mass. There is no tenderness. There is no rebound and no guarding.  Musculoskeletal: Normal range of motion. She exhibits no edema or  tenderness.  Neurological: She is alert and oriented to person, place, and time.  Skin: Skin is warm and dry.  Psychiatric:  Tearful Depressed   Nursing note and vitals reviewed.    ED Treatments / Results  Labs (all labs ordered are listed, but only abnormal results are displayed) Labs Reviewed  ACETAMINOPHEN LEVEL - Abnormal; Notable for the following:       Result Value   Acetaminophen (Tylenol), Serum <10 (*)    All other components within normal limits  COMPREHENSIVE METABOLIC PANEL  ETHANOL  SALICYLATE LEVEL  CBC  RAPID URINE DRUG SCREEN, HOSP PERFORMED    EKG  EKG Interpretation None       Radiology No results found.  Procedures Procedures (including critical care time)  Medications Ordered in ED Medications  acetaminophen (TYLENOL) tablet 650 mg (not administered)     Initial Impression / Assessment and Plan / ED Course  I have reviewed the triage vital signs and the nursing notes.  Pertinent labs & imaging results that were available during my care of the patient were reviewed by me and considered in my medical decision making (see chart for details).  Clinical Course    Patient with suicidal and homicidal thoughts.  Wants to jump off of a bridge or overdose on medications.  States that she would "take her family with her."  No drug or alcohol use.   Patient is medically clear.  She is VOLUNTARY but should be placed under IVC if attempts to leave.  Disposition: Pending TTS evaluation  Final Clinical Impressions(s) / ED Diagnoses   Final diagnoses:  None    New Prescriptions New Prescriptions   No medications on file     Roxy HorsemanRobert Athina Fahey, PA-C 09/25/16 1518    Gerhard Munchobert Lockwood, MD 09/25/16 1745

## 2016-09-25 NOTE — ED Notes (Signed)
Attempted Report x1.   

## 2016-09-25 NOTE — ED Triage Notes (Addendum)
Pt reports has stressful work situation and reports she feels like she would be better off dead. Has plan to jump off parking garage, wreck her car, or overdose. States she has good support system but doesn't tell them anything. Daughter is with her today. States she had some depression issues when a teenager but none recently and does not have psychiatrist. Recently underwent gastric bypass in May. Is tearful but cooperative and had moments of laughing stating "I was a happy fat girl"

## 2016-09-25 NOTE — ED Notes (Signed)
Staffing notified. Security notified.

## 2016-09-25 NOTE — BH Assessment (Addendum)
Tele Assessment Note   Baron HamperKimberly XXXTravis is a 45 y.o. female who [facility] due to depression with SI. Pt presented as depressed and tearful. Pt endorsed feelings of worthlessness. Pt shared that she has been working at [employer] for 18 years and has never had any issues with her performance-always being the go-to person. Pt continued that she had bariatric surgery in May 2017 and, when she came back, she feels as if her supervisor is bullying her. Pt added that she feels singled out, being told that nothing she is doing is correct, and even being written up. Pt expresses being distraught over this turn of events as she is the main breadwinner of the home and she is concerned she may lose her job. Pt endorses active SI with a plan to jump off the parking deck at her job. Pt says she has never felt this way before and really needs some help.   Diagnosis: MDD, single episode, severe  Past Medical History:  Past Medical History:  Diagnosis Date  . Endometriosis   . Sinus trouble     Past Surgical History:  Procedure Laterality Date  . ABLATION     for scar tissue and endometriosis  . ANKLE FRACTURE SURGERY    . CESAREAN SECTION    . GASTRECTOMY    . SINUS EXPLORATION      Family History: History reviewed. No pertinent family history.  Social History:  reports that she has never smoked. She does not have any smokeless tobacco history on file. She reports that she does not drink alcohol. Her drug history is not on file.  Additional Social History:  Alcohol / Drug Use Pain Medications: see PTA meds Prescriptions: see PTA meds Over the Counter: see PTA meds History of alcohol / drug use?: No history of alcohol / drug abuse  CIWA: CIWA-Ar BP: 143/95 Pulse Rate: 72 COWS:    PATIENT STRENGTHS: (choose at least two) Average or above average intelligence Financial means Motivation for treatment/growth Work skills  Allergies: No Known Allergies  Home  Medications:  (Not in a hospital admission)  OB/GYN Status:  No LMP recorded.  General Assessment Data [facility] Assessment: In system Is this a Tele or Face-to-Face Assessment?: Tele Assessment Is this an Initial Assessment or a Re-assessment for this encounter?: Initial Assessment Marital status: Married Is patient pregnant?: No Pregnancy Status: No Living Arrangements: Children, Spouse/significant other Can pt return to current living arrangement?: Yes Admission Status: Voluntary Is patient capable of signing voluntary admission?: Yes Referral Source: Self/Family/Friend Insurance type: BCBS     Crisis Care Plan Living Arrangements: Children, Spouse/significant other Name of Psychiatrist: none Name of Therapist: none  Education Status Is patient currently in school?: No  Risk to self with the past 6 months Suicidal Ideation: Yes-Currently Present Has patient been a risk to self within the past 6 months prior to admission? : No Suicidal Intent: Yes-Currently Present Has patient had any suicidal intent within the past 6 months prior to admission? : No Is patient at risk for suicide?: Yes Suicidal Plan?: Yes-Currently Present Has patient had any suicidal plan within the past 6 months prior to admission? : No Specify Current Suicidal Plan: jump off parking deck at work Access to [employer]: Yes Specify Access to Suicidal Means: access to work parking deck What has been your use of drugs/alcohol within the last 12 months?: pt denies Previous Attempts/Gestures: No Intentional Self Injurious Behavior: None Family Suicide History: Unknown  Recent stressful life event(s): Other (Comment) (work stressors) Persecutory voices/beliefs?: No Depression: Yes Depression Symptoms: Tearfulness, Loss of interest in usual pleasures, Feeling worthless/self pity Substance abuse history and/or treatment for substance abuse?: No Suicide prevention information given to  non-admitted patients: Not applicable  Risk to Others within the past 6 months Homicidal Ideation: No Does patient have any lifetime risk of violence toward others beyond the six months prior to admission? : No Thoughts of Harm to Others: No Current Homicidal Intent: No Current Homicidal Plan: No Access to Homicidal Means: No History of harm to others?: No Assessment of Violence: None Noted Does patient have access to weapons?: No Criminal Charges Pending?: No Does patient have a court date: No Is patient on probation?: No  Psychosis Hallucinations: None noted Delusions: None noted  Mental Status Report Appearance/Hygiene: Unremarkable Eye Contact: Good Motor Activity: Unremarkable Speech: Logical/coherent Level of Consciousness: Alert, Crying Mood: Depressed Affect: Appropriate to circumstance Anxiety Level: Moderate Thought Processes: Coherent, Relevant Judgement: Impaired Orientation: Person, Time, Place, Situation, Appropriate for developmental age Obsessive Compulsive Thoughts/Behaviors: None  Cognitive Functioning Concentration: Normal Memory: Recent Intact, Remote Intact IQ: Average Insight: see judgement above Impulse Control: Unable to Assess Appetite: Poor Sleep: No Change Vegetative Symptoms: None  ADLScreening [facility] Assessment Services) Patient's cognitive ability adequate to safely complete daily activities?: Yes Patient able to express need for assistance with ADLs?: Yes Independently performs ADLs?: Yes (appropriate for developmental age)  Prior Inpatient Therapy Prior Inpatient Therapy: No  Prior Outpatient Therapy Prior Outpatient Therapy: No Does patient have an ACCT team?: No Does patient have Intensive In-House Services?  : No Does patient have [facility] services? : No Does patient have P4CC services?: No  ADL Screening (condition at time of admission) Patient's cognitive ability adequate to safely complete daily activities?: Yes Is the  patient deaf or have difficulty hearing?: No Does the patient have difficulty seeing, even when wearing glasses/contacts?: No Does the patient have difficulty concentrating, remembering, or making decisions?: No Patient able to express need for assistance with ADLs?: Yes Does the patient have difficulty dressing or bathing?: No Independently performs ADLs?: Yes (appropriate for developmental age) Does the patient have difficulty walking or climbing stairs?: No Weakness of Legs: None Weakness of Arms/Hands: None  Home Assistive Devices/Equipment Home Assistive Devices/Equipment: None  Therapy Consults (therapy consults require a physician order) PT Evaluation Needed: No OT Evalulation Needed: No SLP Evaluation Needed: No Abuse/Neglect Assessment (Assessment to be complete while patient is alone) Physical Abuse: Denies Verbal Abuse: Denies Sexual Abuse: Denies Exploitation of patient/patient's resources: Denies Self-Neglect: Denies Values / Beliefs Cultural Requests During Hospitalization: None Spiritual Requests During Hospitalization: None Consults Spiritual Care Consult Needed: No Social Work Consult Needed: No Merchant navy officerAdvance Directives (For Healthcare) Does patient have an advance directive?: No Would patient like information on creating an advanced directive?: No - patient declined information    Additional Information 1:1 In Past 12 Months?: No CIRT Risk: No Elopement Risk: No Does patient have medical clearance?: Yes     Disposition:  Disposition Initial Assessment Completed for this Encounter: Yes (consulted with Elta GuadeloupeLaurie Parks, NP) Disposition of Patient: Inpatient treatment program Type of inpatient treatment program: Adult ([facility]  Laddie AquasSamantha M Nagi Furio 09/25/2016 4:48 PM

## 2016-09-25 NOTE — ED Notes (Signed)
Attempted report x1. 

## 2016-09-26 ENCOUNTER — Encounter (HOSPITAL_COMMUNITY): Payer: Self-pay

## 2016-09-26 DIAGNOSIS — Z9889 Other specified postprocedural states: Secondary | ICD-10-CM

## 2016-09-26 MED ORDER — INFLUENZA VAC SPLIT QUAD 0.5 ML IM SUSY
0.5000 mL | PREFILLED_SYRINGE | INTRAMUSCULAR | Status: AC
  Administered 2016-09-27: 0.5 mL via INTRAMUSCULAR
  Filled 2016-09-26: qty 0.5

## 2016-09-26 NOTE — Progress Notes (Signed)
D: Patient denies SI/HI and A/V hallucinations  A: Monitored q 15 minutes; patient encouraged to attend groups; patient educated about medications; patient given medications per physician orders; patient encouraged to express feelings and/or concerns  R: Patient is bright and animated; patient is pleasant and cooperative; patient is appropriate to circumstances; patient's interaction with staff and peers is approrpiate; patient was able to set goal to talk with staff 1:1 when having feelings of SI; patient is attending all groups

## 2016-09-26 NOTE — [facility] Group Notes (Signed)
[facility] Group Notes:  (Nursing/MHT/Case Management/Adjunct)  Date:  09/26/2016  Time:  8:00pm  Type of Therapy:  Wrap up group  Participation Level:  Active  Participation Quality:  Appropriate and Attentive  Affect:  Bright  Cognitive:  Alert and Appropriate  Insight:  Good  Engagement in Group:  Supportive  Modes of Intervention:  Discussion  Summary of Progress/Problems:  Topic of group was wrap up and discussing goals for the day.  She reported that her goal for today was "talking to the doctor and telling her story."  She felt better after talking with the doctor and social worker today.  "I have a plan for next week and they are going to schedule appointments for follow up so I don't get to where I was before."    Levin BaconHeather V Keah Lamba 09/26/2016, 9:32 PM

## 2016-09-26 NOTE — Progress Notes (Signed)
Elizabeth BradfordKimberly is a 45 y.o. female being [facility]  She came to the ED for depression with SI.  She was depressed and tearful with feelings of worthlessness. She has been working at [employer] for 18 years and has never had any issues with her performance-always being the go-to person. After having bariatric surgery in May 2017, she feels as if her supervisor is bullying her and feels singled out.  She was even written up and this has increased her worry that she might lose her job.  She is the primary bread winner of the family.  She spoke with [employer] at work and they encouraged her to come and get help.  Since being here and [facility], "I feel much better."  She denies current suicidal ideation and is able to contract for safety on the unit.  She denies any medical issues at this time and appears to be in no physical distress.  She denies HI or A/V hallucinations.   She is diagnosed with Major Depressive Disorder, single episode, severe.  Oriented her to the unit.  Admission paperwork completed and signed.  She came in with no belongings, "I sent my belongings with my daughter."  Skin assessment completed and noted c-section scar, gastric sleeve scars and old left ankle surgery scar.  Q 15 minute checks initiated for safety.  We will monitor the progress towards her goals.

## 2016-09-26 NOTE — Progress Notes (Signed)
D    Pt is pleasant on approach   She interacts well with others and is compliant with her treatment    She endorses depression and anxiety with some improvement   She spent the evening in the dayroom interacting with several other patients and was pleasant and appropriate A    Verbal support given   Medications administered and effectiveness monitored    Q 15 min checks R    Pt is safe at present time and receptive to verbal support 

## 2016-09-26 NOTE — [facility] Suicide Risk Assessment (Signed)
[facility] INPATIENT:  Family/Significant Other Suicide Prevention Education  Suicide Prevention Education:  Patient Refusal for Family/Significant Other Suicide Prevention Education: The patient Elizabeth Waller has refused to provide written consent for family/significant other to be provided Family/Significant Other Suicide Prevention Education during admission and/or prior to discharge.  Physician notified. SPE reviewed with patient and brochure provided. Patient encouraged to return to hospital if having suicidal thoughts, patient verbalized his/her understanding and has no further questions at this time.   Zadyn Yardley L Viaan Knippenberg 09/26/2016, 3:26 PM

## 2016-09-26 NOTE — Progress Notes (Addendum)
D: Pt. is up and visible in the room, resting comfortably in bed. Denies having any SI/HI/AVH/Pain at this time. Pt. presents with an anxious & depressed affect and mood. Pt. states "I am ready for bed; It's like an arctic in here". Pt. was given blankets and offered fluids & snacks, fluids accepted. Pt. was cooperative and calm with writer and did not required nighttime meds. at this time. Pt. states she takes Prilosec, Vitamins, and use Eucerin at home.   A: Encouragement and support given. No meds. ordered at this time.   R: 1:1 interaction in private. Safety maintained with Q 15 checks. Continues to follow treatment plan and will monitor closely. No questions/concerns at this time.

## 2016-09-26 NOTE — Progress Notes (Signed)
Recreation Therapy Notes  Date: 09/26/16 Time: 0930 Location: 300 Hall Group Room  Group Topic: Stress Management  Goal Area(s) Addresses:  Patient will verbalize importance of using healthy stress management.  Patient will identify positive emotions associated with healthy stress management.   Intervention: Stress Management  Activity :  Progressive Muscle Relaxation. LRT introduced that stress management technique of progressive muscle relaxation.  LRT read a script to allow patients to engage in the activity.  Patients were to follow along as LRT read script.  Education:  Stress Management, Discharge Planning.   Education Outcome: Acknowledges edcuation/In group clarification offered/Needs additional education  Clinical Observations/Feedback: Pt did not attend group.   Caroll RancherMarjette Alleyne Lac, LRT/CTRS         Caroll RancherLindsay, Ohana Birdwell A 09/26/2016 12:02 PM

## 2016-09-26 NOTE — [facility] Group Notes (Signed)
[facility] LCSW Group Therapy 09/26/2016 1:15pm  Type of Therapy: Group Therapy- Feelings Around Relapse and Recovery  Participation Level: Active   Participation Quality:  Appropriate  Affect:  Appropriate  Cognitive: Alert and Oriented   Insight:  Developing   Engagement in Therapy: Developing/Improving and Engaged   Modes of Intervention: Clarification, Confrontation, Discussion, Education, Exploration, Limit-setting, Orientation, Problem-solving, Rapport Building, Dance movement psychotherapisteality Testing, Socialization and Support  Summary of Progress/Problems: The topic for today was feelings about relapse. The group discussed what relapse prevention is to them and identified triggers that they are on the path to relapse. Members also processed their feeling towards relapse and were able to relate to common experiences. Group also discussed coping skills that can be used for relapse prevention.  Pt participated actively in group discussion and was able to identify warning signs related to her most recent crisis. Pt processed her conflict between feeling loyal to her company and loyal to herself and she feels she has to make a choice between the two. Pt was able to relate to experiences of others and received feedback appropriately.   Therapeutic Modalities:   Cognitive Behavioral Therapy Solution-Focused Therapy Assertiveness Training Relapse Prevention Therapy    Elizabeth FusiLauren Ellianne Gowen, LCSW [phone] 09/26/2016 3:25 PM

## 2016-09-26 NOTE — Tx Team (Signed)
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 acetaminophen (TYLENOL) tablet 650 mg  650 mg Oral Q6H PRN Laveda AbbeLaurie Britton Parks, NP      . alum & mag hydroxide-simeth (MAALOX/MYLANTA) 200-200-20 MG/5ML suspension 30 mL  30 mL Oral Q4H PRN Laveda AbbeLaurie Britton Parks, NP      . hydrOXYzine (ATARAX/VISTARIL) tablet 25 mg  25 mg Oral Q6H PRN Laveda AbbeLaurie Britton Parks, NP      . Melene Muller[START ON 09/27/2016] Influenza vac split quadrivalent PF (FLUARIX) injection 0.5 mL  0.5 mL Intramuscular Tomorrow-1000 Fernando A Cobos, MD      . magnesium hydroxide (MILK OF MAGNESIA) suspension 30 mL  30 mL Oral Daily PRN Laveda AbbeLaurie Britton Parks, NP      . traZODone (DESYREL) tablet 50 mg  50 mg Oral QHS PRN Laveda AbbeLaurie Britton Parks, NP       PTA Medications: Prescriptions Prior to Admission  Medication Sig Dispense Refill Last Dose  . Cholecalciferol (VITAMIN D3) 1000 units CAPS Take 1,000 Units by mouth daily.   09/25/2016  . Multiple Vitamin (MULTIVITAMIN WITH MINERALS) TABS tablet Take 1 tablet by mouth daily.   09/25/2016  . omeprazole (PRILOSEC) 20 MG capsule Take 20 mg by mouth 2 (two) times daily.   09/25/2016  . ursodiol (ACTIGALL) 300 MG capsule Take 300 mg by mouth 2 (two) times daily.   09/25/2016    Patient Stressors: Occupational concerns Other: recent gastric sleeve surgery for weight loss  Patient Strengths: Active sense of humor Average or above average intelligence Capable of independent living MetallurgistCommunication skills Financial means General fund of knowledge Motivation for  treatment/growth  Treatment Modalities: Medication Management, Group therapy, Case management,  1 to 1 session with clinician, Psychoeducation, Recreational therapy.   Physician Treatment Plan for Primary Diagnosis: Adjustment Disorder with Depression and Behavioral Disturbance, Suicidal Ideations  Long Term Goal(s):     Short Term Goals:    Medication Management: Evaluate patient's response, side effects, and tolerance of medication regimen.  Therapeutic Interventions: 1 to 1 sessions, Unit Group sessions and Medication administration.  Evaluation of Outcomes: Progressing  Physician Treatment Plan for Secondary Diagnosis: Active Problems:   MDD (major depressive disorder), single episode, severe , no psychosis (HCC)  Long Term Goal(s):     Short Term Goals:       Medication Management: Evaluate patient's response, side effects, and tolerance of medication regimen.  Therapeutic Interventions: 1 to 1 sessions, Unit Group sessions and Medication administration.  Evaluation of Outcomes: Progressing   RN Treatment Plan for Primary Diagnosis: Adjustment Disorder with Depression and Behavioral Disturbance, Suicidal Ideations  Long Term Goal(s): Knowledge of disease and therapeutic regimen to maintain health will improve  Short Term Goals: Ability to remain free from injury will improve, Ability to disclose and discuss suicidal ideas, Ability to identify and develop effective coping behaviors will improve and Compliance with prescribed medications will improve  Medication Management: RN will administer medications as ordered by provider, will assess and evaluate patient's response and provide education to patient for prescribed medication. RN will report any adverse and/or side effects to prescribing provider.  Therapeutic Interventions: 1 on 1 counseling sessions, Psychoeducation, Medication administration,  Evaluate responses to treatment, Monitor vital signs and CBGs as ordered,  Perform/monitor CIWA, COWS, AIMS and Fall Risk screenings as ordered, Perform wound care treatments as ordered.  Evaluation of Outcomes: Progressing   LCSW Treatment Plan for Primary Diagnosis: Adjustment Disorder with Depression and Behavioral Disturbance, Suicidal Ideations   Long Term Goal(s): Safe transition to appropriate next level of care at discharge, Engage patient in therapeutic group addressing interpersonal concerns.  Short Term Goals: Engage patient in aftercare planning with referrals and resources, Increase social support, Increase emotional regulation, Identify triggers associated with mental health/substance abuse issues and Increase skills for wellness and recovery  Therapeutic Interventions: Assess for all discharge needs, 1 to 1 time with Social worker, Explore available resources and support systems, Assess for adequacy in community support network, Educate family and significant other(s) on suicide prevention, Complete Psychosocial Assessment, Interpersonal group therapy.  Evaluation of Outcomes: Progressing    Progress in Treatment :  Attending groups: Continuing to assess  Participating in groups: Continuing to assess  Taking medication as prescribed: Yes, MD continuing to assess for appropriate medication regimen  Toleration medication: Yes  Family/Significant other contact made: No, patient has declined for collateral contact. Patient reports that she has been keeping her family updated with her progress  Patient understands diagnosis: Yes  Discussing patient identified problems/goals with staff: Yes  Medical problems stabilized or resolved: Yes  Denies suicidal/homicidal ideation: Yes, denies  Issues/concerns per patient self-inventory: None reported  Other: N/A  New problem(s) identified: None reported at this time    New Short Term/Long Term Goal(s): None at this time    Discharge Plan or Barriers: Patient plans to return home to follow up  with outpatient services.    Reason for Continuation of Hospitalization: Anxiety Depression Medication stabilization Suicidal Ideations Withdrawal symptoms  Estimated Length of Stay: Discharge anticipated for Sunday 09/28/16    Attendees:  Patient:   Physician: Dr. Randa Evensates, Cobos , MD  09/26/2016   9:30am  Nursing: Jerrilyn CairoPatty Duke, Britney Tyson, Penny Carter, RN 09/26/2016 9:30am  RN Care Manager: Onnie BoerJennifer Clark, CM  09/26/2016 9:30am  Social Workers: Chad CordialLauren Carter, LCSW, Samuella BruinKristin Prathik Aman, LCSW, Heather Smart, LCSW   09/26/2016 9:30am  Nurse Pratictioners: Gray BernhardtMay Augustin, NP, Claudette Headonrad Withrow, NP 09/26/2016 9:30am  Other:     Scribe for Treatment Team: Samuella BruinKristin Sonja Manseau, LCSW Clinical Social Worker [facility] [phone]

## 2016-09-26 NOTE — Plan of Care (Signed)
Problem: Safety: Goal: Periods of time without injury will increase Outcome: Progressing Pt. remains a low fall risk, denies SI/HI at this time, Q 15 checks in place.    

## 2016-09-26 NOTE — H&P (Signed)
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 MDD (major depressive disorder), single episode, severe , no psychosis (HCC) [F32.2] 09/25/2016   History of Present Illness: 45 year old female. Reports she has been under a lot of stress regarding her work. States that after many years of good performance at work, her boss has lately been questioning her work and recently  put her on a probationary status. This has contributed to worsening depression, and states " I feel I got to my breaking point " yesterday.  She states she has been feeling depressed, sad, feeling very stressed related to her work related stressors - reports that a couple of days ago her boss gave her further negative feedback in spite of her best efforts, resulting in increased depression, with thoughts of crashing car or jumping off a height. States she had not been having suicidal ideations before. She contacted her job's EAP and was encouraged to come to hospital.  Associated Signs/Symptoms: Depression Symptoms:  depressed mood, suicidal thoughts with specific plan, anxiety,   of note denies anhedonia, denies pervasive sense of sadness, reports good sense of self esteem, and denies any changes in appetite, sleep, or energy levels (Hypo) Manic Symptoms:  Denies  Anxiety Symptoms:  Anxiety related to work related stressors, but denies panic or agoraphobia Psychotic Symptoms:  Denies  PTSD Symptoms: Denies  Total Time spent with patient: 45 minutes  Past Psychiatric History: no prior psychiatric admissions, no history of suicide attempts, no history of self cutting, denies panic attacks, denies  agoraphobia, denies social phobia, denies any history of psychosis, denies mania, denies PTSD, has never been on any psychiatric medications , denies history of violence .   Is the patient at risk to self? Yes.    Has the patient been a risk to self in the past 6 months? No.  Has the patient been a risk to self within the distant past? No.  Is the patient a risk to others? No.  Has the patient been a risk to others in the past 6 months? No.  Has the patient been a risk to others within the distant past? No.   Prior Inpatient Therapy:  denies  Prior Outpatient Therapy:  no   Alcohol Screening: 1. How often do you have a drink containing alcohol?: Never 9. Have you or someone else been injured as a result of your drinking?: No 10. Has a relative or friend or a doctor or another health worker been concerned about your drinking or suggested you cut down?: No Alcohol Use Disorder Identification Test Final Score (AUDIT): 0 Brief Intervention: AUDIT score less than 7 or less-screening does not suggest unhealthy drinking-brief intervention not indicated Substance Abuse History in the last 12 months: denies any alcohol or drug abuse  Consequences of Substance Abuse: Denies  Previous Psychotropic Medications: states has never been on medications  Psychological Evaluations:  No  Past Medical History:  Had bariatric surgery ( sleeve) on May 1st, has lost 50 + lbs .  Past Medical History:  Diagnosis Date  . Endometriosis   . GERD (gastroesophageal reflux disease)   . Sinus trouble     Past Surgical History:  Procedure Laterality Date  .  ABLATION     for scar tissue and endometriosis  . ANKLE FRACTURE SURGERY    . CESAREAN SECTION    . GASTRECTOMY    . SINUS EXPLORATION     Family History:  Parents alive, live together, has one sibling Family Psychiatric  History:  No history of mental illness in family, no history of suicides, no history of substance abuse  Tobacco Screening: Have you used  any form of tobacco in the last 30 days? (Cigarettes, Smokeless Tobacco, Cigars, and/or Pipes): No Social History: 45 year old, employed, work related stressors as above, married x 22 years, has three children ( 21, 17, 14) . States family situation is stable, husband is supportive. Denies legal or financial difficulties .  History  Alcohol Use No     History  Drug Use No    Additional Social History:      Pain Medications: see PTA meds Prescriptions: see PTA meds Over the Counter: see PTA meds History of alcohol / drug use?: No history of alcohol / drug abuse  Allergies:  No Known Allergies Lab Results: No results found for this or any previous visit (from the past 48 hour(s)).  Blood Alcohol level:  Lab Results  Component Value Date   ETH <5 09/25/2016    Metabolic Disorder Labs:  No results found for: HGBA1C, MPG No results found for: PROLACTIN No results found for: CHOL, TRIG, HDL, CHOLHDL, VLDL, LDLCALC  Current Medications: Current Facility-Administered Medications  Medication Dose Route Frequency Provider Last Rate Last Dose  . acetaminophen (TYLENOL) tablet 650 mg  650 mg Oral Q6H PRN Laveda AbbeLaurie Britton Parks, NP      . alum & mag hydroxide-simeth (MAALOX/MYLANTA) 200-200-20 MG/5ML suspension 30 mL  30 mL Oral Q4H PRN Laveda AbbeLaurie Britton Parks, NP      . hydrOXYzine (ATARAX/VISTARIL) tablet 25 mg  25 mg Oral Q6H PRN Laveda AbbeLaurie Britton Parks, NP      . Melene Muller[START ON 09/27/2016] Influenza vac split quadrivalent PF (FLUARIX) injection 0.5 mL  0.5 mL Intramuscular Tomorrow-1000 Fernando A Cobos, MD      . magnesium hydroxide (MILK OF MAGNESIA) suspension 30 mL  30 mL Oral Daily PRN Laveda AbbeLaurie Britton Parks, NP      . traZODone (DESYREL) tablet 50 mg  50 mg Oral QHS PRN Laveda AbbeLaurie Britton Parks, NP       PTA Medications: Prescriptions Prior to Admission  Medication Sig Dispense Refill Last Dose  . Cholecalciferol (VITAMIN D3) 1000 units CAPS Take 1,000 Units by mouth daily.   09/25/2016  .  Multiple Vitamin (MULTIVITAMIN WITH MINERALS) TABS tablet Take 1 tablet by mouth daily.   09/25/2016  . omeprazole (PRILOSEC) 20 MG capsule Take 20 mg by mouth 2 (two) times daily.   09/25/2016  . ursodiol (ACTIGALL) 300 MG capsule Take 300 mg by mouth 2 (two) times daily.   09/25/2016    Musculoskeletal: Strength & Muscle Tone: normal Gait & Station: normal Patient leans: N/A  Psychiatric Specialty Exam: Physical Exam  Review of Systems  Constitutional: Positive for weight loss.       Associated with bariatric surgery a few months ago  HENT: Negative.   Eyes: Negative.   Respiratory: Negative.   Cardiovascular: Negative.   Gastrointestinal: Negative for abdominal pain, blood in stool, diarrhea, heartburn, nausea and vomiting.  Genitourinary: Negative.   Musculoskeletal: Negative.   Skin: Negative.   Neurological: Negative for seizures.  Endo/Heme/Allergies: Negative.   Psychiatric/Behavioral: Positive for depression and suicidal ideas.  All other  systems reviewed and are negative.   Blood pressure 119/65, pulse 88, temperature 98.4 F (36.9 C), temperature source Oral, resp. rate 16, height 5\' 7"  (1.702 m), weight 175 lb (79.4 kg).Body mass index is 27.41 kg/m.  General Appearance: Well Groomed  Eye Contact:  Good  Speech:  Normal Rate  Volume:  Normal  Mood:  States she is feeling better, less depressed   Affect:  appropriate, reactive, smiles at times appropriately  Thought Process:  Linear  Orientation:  Full (Time, Place, and Person)  Thought Content:  no hallucinations, no delusions , not internally preoccupied, ruminates about work related stressors as above  Suicidal Thoughts:  No denies any suicidal or self injurious ideations, contracts for safety   Homicidal Thoughts:  No denies any violent or homicidal ideations, and specifically denies any violent or homicidal ideations towards boss or anyone else   Memory:  recent and remote grossly intact  Judgement:  Other:   improving   Insight:  Fair  Psychomotor Activity:  Normal  Concentration:  Concentration: Good and Attention Span: Good  Recall:  Good  Fund of Knowledge:  Good  Language:  Good  Akathisia:  Negative  Handed:  Right  AIMS (if indicated):     Assets:  Communication Skills Desire for Improvement Resilience Social Support  ADL's:  Intact  Cognition:  WNL  Sleep:  Number of Hours: 5.25    Treatment Plan Summary: Daily contact with patient to assess and evaluate symptoms and progress in treatment, Medication management, Plan inpatient admission  and medications as below  Observation Level/Precautions:  15 minute checks  Laboratory:  As needed  Psychotherapy:  Milieu, support, groups   Medications:  We discussed options- at this time patient reports she feels that she may not need an antidepressant medication/standing psychiatric medication. We agreed to continue to  Monitor mood , progress  and reevaluate possible antidepressant medication depending on how she progresses  Trazodone and Vistaril PRNs as needed  Consultations: as needed    Discharge Concerns: -    Estimated LOS: 4 days   Other:     Physician Treatment Plan for Primary Diagnosis:  Suicidal ideations  Long Term Goal(s): Improvement in symptoms so as ready for discharge  Short Term Goals: Ability to verbalize feelings will improve, Ability to disclose and discuss suicidal ideas and Ability to demonstrate self-control will improve  Physician Treatment Plan for Secondary Diagnosis: Active Problems:   MDD (major depressive disorder), single episode, severe , no psychosis (HCC)  Long Term Goal(s): Improvement in symptoms so as ready for discharge  Short Term Goals: Ability to verbalize feelings will improve, Ability to disclose and discuss suicidal ideas and Ability to demonstrate self-control will improve  I certify that inpatient services furnished can reasonably be expected to improve the patient's condition.     Nehemiah MassedOBOS, FERNANDO, MD 11/3/20179:24 AM

## 2016-09-26 NOTE — Tx Team (Signed)
Initial Treatment Plan 09/26/2016 1:26 AM Elizabeth Waller ZOX:[id number]    PATIENT STRESSORS: Occupational concerns Other: recent gastric sleeve surgery for weight loss   PATIENT STRENGTHS: Active sense of humor Average or above average intelligence Capable of independent living Communication skills Financial means General fund of knowledge Motivation for treatment/growth   PATIENT IDENTIFIED PROBLEMS: Depression  Anxiety  Suicidal ideation  "Ways to cope with my anxiety and negative thoughts"  "To gain some tools that I can use and share with my children (if they would ever need it)"             DISCHARGE CRITERIA:  Improved stabilization in mood, thinking, and/or behavior Verbal commitment to aftercare and medication compliance  PRELIMINARY DISCHARGE PLAN: Outpatient therapy Medication management  PATIENT/FAMILY INVOLVEMENT: This treatment plan has been presented to and reviewed with the patient, Elizabeth Waller.  The patient and family have been given the opportunity to ask questions and make suggestions.  Levin BaconHeather V Dwayne Bulkley, RN 09/26/2016, 1:26 AM

## 2016-09-26 NOTE — [facility] Suicide Risk Assessment (Signed)
[facility]   Nursing information obtained from:   patient and chart  Demographic factors:   45 year old married female, employed, three children Current Mental Status:   see below Loss Factors:   work related stressors Historical Factors:   history of depression, bariatric surgery earlier this year- no history of prior psychiatric admissions or of suicidal attempts  Risk Reduction Factors:   resilience, sense of responsibility to family, family supportive  Total Time spent with patient: 45 minutes Principal Problem:  Suicidal ideations Diagnosis:   Patient Active Problem List   Diagnosis Date Noted  . MDD (major depressive disorder), single episode, severe , no psychosis (HCC) [F32.2] 09/25/2016     Continued Clinical Symptoms:  Alcohol Use Disorder Identification Test Final Score (AUDIT): 0 The "Alcohol Use Disorders Identification Test", Guidelines for Use in Primary Care, Second Edition.  World Science [employer]Health [employer] [facility](WHO). Score between 0-7:  no or low risk or alcohol related problems. Score between 8-15:  moderate risk of alcohol related problems. Score between 16-19:  high risk of alcohol related problems. Score 20 or above:  warrants further diagnostic evaluation for alcohol dependence and treatment.   CLINICAL FACTORS:  45 year old married , employed female. Reports that after years of doing well at work and being highly regarded by bosses, her new boss has been unhappy with her performance and has placed her on probationary period. States this has caused anxiety, depression, and states yesterday prior to admission felt acutely overwhelmed and had some suicidal ideations. Denies prior psychiatric history and does not endorse significant neuro-vegetative symptoms of depression    Psychiatric Specialty Exam: Physical Exam  ROS  Blood pressure 119/65, pulse 88, temperature 98.4 F (36.9 C), temperature source Oral, resp. rate 16, height 5\' 7"  (1.702  m), weight 175 lb (79.4 kg).Body mass index is 27.41 kg/m.   see admit note MSE    COGNITIVE FEATURES THAT CONTRIBUTE TO RISK:  Closed-mindedness and Loss of executive function    SUICIDE RISK:   Moderate:  Frequent suicidal ideation with limited intensity, and duration, some specificity in terms of plans, no associated intent, good self-control, limited dysphoria/symptomatology, some risk factors present, and identifiable protective factors, including available and accessible social support.   PLAN OF CARE: Patient will be admitted to inpatient psychiatric unit for stabilization and safety. Will provide and encourage milieu participation. Provide medication management and maked adjustments as needed.  Will follow daily.    I certify that inpatient services furnished can reasonably be expected to improve the patient's condition.  Nehemiah MassedOBOS, FERNANDO, MD 09/26/2016, 12:51 PM

## 2016-09-27 DIAGNOSIS — Z79899 Other long term (current) drug therapy: Secondary | ICD-10-CM

## 2016-09-27 DIAGNOSIS — F322 Major depressive disorder, single episode, severe without psychotic features: Secondary | ICD-10-CM

## 2016-09-27 LAB — TSH: TSH: 1.037 u[IU]/mL (ref 0.350–4.500)

## 2016-09-27 NOTE — B[facility] Counselor (Signed)
Adult Comprehensive Assessment  Patient ID: Elizabeth Waller, female   DOB: 14-Sep-1971, 45 y.o.   MRN: [id number]  Information Source: Information source: Patient  Current Stressors:  Educational / Learning stressors: Denies Employment / Job issues: Reports significant stress related to her relationship with her supervisor. 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 She feels bullied and singled out by her new boss over the past several months Family Relationships: Reports good relationships with her family who are supportive Financial / Lack of resources (include bankruptcy): She is the "bread winner" for her family Housing / Lack of housing: Lives with her husband and 3 children in the [place] area Physical health (include injuries & life threatening diseases): Had bariatric surger y in May 2017 Social relationships: Denies Substance abuse: Denies  Bereavement / Loss: Denies   Living/Environment/Situation:  Living Arrangements: Spouse/significant other, Children Living conditions (as described by patient or guardian): Lives with her husband and 3 children in the [place] area How long has patient lived in current situation?: 1998 What is atmosphere in current home: Comfortable, Supportive  Family History:  Marital status: Married Number of Years Married: 22 What types of issues is patient dealing with in the relationship?: Identifies husband as supportive Does patient have children?: Yes How many children?: 3 How is patient's relationship with their children?: Reports a good relationship with children ages 1321, 1617, and 47 y.o.  Childhood History:  By whom was/is the patient raised?: Both parents Description of patient's relationship with caregiver when they were a child: Good, stable relationship with parents Patient's description of current relationship with people who raised him/her: Good, stable relationship with parents. Talks to them  often Does patient have siblings?: Yes Number of Siblings: 1 Description of patient's current relationship with siblings: Good relationship with her sister  Did patient suffer any verbal/emotional/physical/sexual abuse as a child?: No Did patient suffer from severe childhood neglect?: No Has patient ever been sexually abused/assaulted/raped as an adolescent or adult?: No Was the patient ever a victim of a crime or a disaster?: No Witnessed domestic violence?: No Has patient been effected by domestic violence as an adult?: No  Education:  Currently a Consulting civil engineerstudent?: No Learning disability?: No  Employment/Work Situation:   Employment situation: Employed Where is patient currently employed?: [employer] How long has patient been employed?: 18 years Patient's job has been impacted by current illness: Yes Describe how patient's job has been impacted: relationship with supervisor is causing significant stress What is the longest time patient has a held a job?: see above Has patient ever been in the [employer] and Companymilitary?: No  Financial Resources:   Financial resources: Income from employment Does patient have a representative payee or guardian?: No  Alcohol/Substance Abuse:   What has been your use of drugs/alcohol within the last 12 months?: Denies If attempted suicide, did drugs/alcohol play a role in this?: No Alcohol/Substance Abuse Treatment Hx: Denies past history Has alcohol/substance abuse ever caused legal problems?: No  Social Support System:   Patient's Community Support System: Good Describe Community Support System: Family Type of faith/religion: Patient did not  How does patient's faith help to cope with current illness?: Patient did not share  Leisure/Recreation:   Leisure and Hobbies: Clinical cytogeneticistcrafting, Estate agentmaking wreaths and arrangements  Strengths/Needs:   What things does the patient do well?: creative, cooking, gardening In what areas does patient struggle / problems for patient:  dealing with work stress, feeling criticized by supervisor  Discharge Plan:   Does patient  have access to transportation?: Yes Will patient be returning to same living situation after discharge?: Yes Currently receiving community mental health services: No If no, would patient like referral for services when discharged?: Yes (What county?) Medical sales representative([place]) Does patient have financial barriers related to discharge medications?: No  Summary/Recommendations:     Patient is a 45 year old female who presented to the hospital with 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 Primary triggers for admission include work stressors. Patient will benefit from crisis stabilization, medication evaluation, group therapy and psycho education in addition to case management for discharge planning. At discharge, it is recommended that Pt remain compliant with established discharge plan and continued treatment.  Ryun Velez L Lempi Edwin. 09/27/2016

## 2016-09-27 NOTE — Progress Notes (Signed)
Data. Patient denies SI/HI/AVH.   Patient interacting well with staff and other patients. She has been observed in the milieu, being supportive of other patients.  Her affect and she is bright on approach. She did request of the provider to leave today, but was able to appropriately accept her not leaving until tomorrow. On her self assessment she reports, 0/10 for anxiety, depression and hopelessness. Her goal for today is: "Going home". Action. Emotional support and encouragement offered. Education provided on medication, indications and side effect. Q 15 minute checks done for safety. Response. Safety on the unit maintained through 15 minute checks.  Medications taken as prescribed. Attended groups. Remained calm and appropriate through out shift.

## 2016-09-27 NOTE — Progress Notes (Signed)
D    Pt is pleasant on approach   She interacts well with others and is compliant with her treatment    She endorses depression and anxiety with some improvement   She spent the evening in the dayroom interacting with several other patients and was pleasant and appropriate A    Verbal support given   Medications administered and effectiveness monitored    Q 15 min checks R    Pt is safe at present time and receptive to verbal support

## 2016-09-27 NOTE — [facility] Group Notes (Signed)
Adult Group Therapy Note  Date:  09/27/2016  Time: 10:00AM-11:15AM  Group Topic/Focus: Today's group focused on the topic of fear and healthy coping skills.  An exercise was performed which elicited sources of fear that various patients feel, giving an opportunity for other patients to identify with that fear.  After a discussion of each, a coping skill was named that could be attempted in the future when such a fear arises.    Participation Level:  Active  Participation Quality:  Attentive, Sharing and Supportive  Affect:  Appropriate  Cognitive:  Appropriate and Oriented  Insight: Good  Engagement in Group:  Engaged  Modes of Intervention:  Activity, Discussion and Support  Additional Comments:  The patient expressed much support for other patients and was positive and goal-oriented throughout group.  Carloyn JaegerMareida J Grossman-Orr 09/27/2016 , 1:18 PM

## 2016-09-27 NOTE — Progress Notes (Signed)
[facility]  09/27/2016 10:09 AM Elizabeth Waller  MRN:  [id number] Subjective:  "I feel good today. I was overwhelmed the other day at work. I have a supportive family, kids, pets, and I would never actually harm myself. Dr. Jama Flavorsobos said I could go today or tomorrow."   Objective: Pt seen and chart reviewed. Pt is alert/oriented x4, calm, cooperative, and appropriate to situation. Pt denies suicidal/homicidal ideation and psychosis and does not appear to be responding to internal stimuli. Pt reports that she has benefited from participating in group therapy and that she feels she has learned a lot. Pt reports that she feels safe and comfortable for discharge.    Principal Problem: MDD (major depressive disorder), single episode, severe , no psychosis (HCC) Diagnosis:   Patient Active Problem List   Diagnosis Date Noted  . MDD (major depressive disorder), single episode, severe , no psychosis (HCC) [F32.2] 09/25/2016    Priority: High   Total Time spent with patient: 25 minutes  Past Psychiatric History: see H&P  Past Medical History:  Past Medical History:  Diagnosis Date  . Endometriosis   . GERD (gastroesophageal reflux disease)   . Sinus trouble     Past Surgical History:  Procedure Laterality Date  . ABLATION     for scar tissue and endometriosis  . ANKLE FRACTURE SURGERY    . CESAREAN SECTION    . GASTRECTOMY    . SINUS EXPLORATION     Family History: History reviewed. No pertinent family history. Family Psychiatric  History: see H&P Social History:  History  Alcohol Use No     History  Drug Use No    Social History   Social History  . Marital status: Married    Spouse name: N/A  . Number of children: N/A  . Years of education: N/A   Social History Main Topics  . Smoking status: Never Smoker  . Smokeless tobacco: Never Used  . Alcohol use No  . Drug use: No  . Sexual activity: Yes    Birth control/ protection: Surgical   Other Topics Concern   . None   Social History Narrative  . None   Additional Social History:    Pain Medications: see PTA meds Prescriptions: see PTA meds Over the Counter: see PTA meds History of alcohol / drug use?: No history of alcohol / drug abuse                    Sleep: Good  Appetite:  Good  Current Medications: Current Facility-Administered Medications  Medication Dose Route Frequency Provider Last Rate Last Dose  . acetaminophen (TYLENOL) tablet 650 mg  650 mg Oral Q6H PRN Laveda AbbeLaurie Britton Parks, NP      . alum & mag hydroxide-simeth (MAALOX/MYLANTA) 200-200-20 MG/5ML suspension 30 mL  30 mL Oral Q4H PRN Laveda AbbeLaurie Britton Parks, NP      . hydrOXYzine (ATARAX/VISTARIL) tablet 25 mg  25 mg Oral Q6H PRN Laveda AbbeLaurie Britton Parks, NP      . Influenza vac split quadrivalent PF (FLUARIX) injection 0.5 mL  0.5 mL Intramuscular Tomorrow-1000 Fernando A Cobos, MD      . magnesium hydroxide (MILK OF MAGNESIA) suspension 30 mL  30 mL Oral Daily PRN Laveda AbbeLaurie Britton Parks, NP      . traZODone (DESYREL) tablet 50 mg  50 mg Oral QHS PRN Laveda AbbeLaurie Britton Parks, NP        Lab Results:  Results for orders placed or performed during  the hospital encounter of 09/25/16 (from the past 48 hour(s))  TSH     Status: None   Collection Time: 09/27/16  6:17 AM  Result Value Ref Range   TSH 1.037 0.350 - 4.500 uIU/mL    Comment: Performed by a 3rd Generation assay with a functional sensitivity of <=0.01 uIU/mL. Performed at [facility]     Blood Alcohol level:  Lab Results  Component Value Date   [facility] <5 09/25/2016    Metabolic Disorder Labs: No results found for: HGBA1C, MPG No results found for: PROLACTIN No results found for: CHOL, TRIG, HDL, CHOLHDL, VLDL, LDLCALC  Physical Findings: AIMS: Facial and Oral Movements Muscles of Facial Expression: None, normal Lips and Perioral Area: None, normal Jaw: None, normal Tongue: None, normal,Extremity Movements Upper (arms, wrists, hands,  fingers): None, normal Lower (legs, knees, ankles, toes): None, normal, Trunk Movements Neck, shoulders, hips: None, normal, Overall Severity Severity of abnormal movements (highest score from questions above): None, normal Incapacitation due to abnormal movements: None, normal Patient's awareness of abnormal movements (rate only patient's report): No Awareness, Dental Status Current problems with teeth and/or dentures?: No Does patient usually wear dentures?: No  CIWA:    COWS:     Musculoskeletal: Strength & Muscle Tone: within normal limits Gait & Station: normal Patient leans: N/A  Psychiatric Specialty Exam: Physical Exam  Review of Systems  Psychiatric/Behavioral: Positive for depression. Negative for hallucinations, substance abuse and suicidal ideas. The patient is not nervous/anxious and does not have insomnia.   All other systems reviewed and are negative.   Blood pressure 117/78, pulse 71, temperature 98.5 F (36.9 C), temperature source Oral, resp. rate 18, height 5\' 7"  (1.702 m), weight 79.4 kg (175 lb), SpO2 100 %.Body mass index is 27.41 kg/m.  General Appearance: Casual and Fairly Groomed  Eye Contact:  Good  Speech:  Clear and Coherent and Normal Rate  Volume:  Normal  Mood:  Euthymic  Affect:  Appropriate and Congruent  Thought Process:  Coherent, Goal Directed, Linear and Descriptions of Associations: Intact  Orientation:  Full (Time, Place, and Person)  Thought Content:  Planning, work, support system  Suicidal Thoughts:  No  Homicidal Thoughts:  No  Memory:  Immediate;   Fair Recent;   Fair Remote;   Fair  Judgement:  Fair  Insight:  Fair  Psychomotor Activity:  Normal  Concentration:  Concentration: Fair and Attention Span: Fair  Recall:  Fair  Fund of Knowledge:  Good  Language:  Good  Akathisia:  No  Handed:    AIMS (if indicated):     Assets:  Communication Skills Desire for Improvement Financial  Resources/Insurance Housing Intimacy Leisure Time Resilience Social Support Talents/Skills Transportation Vocational/Educational  ADL's:  Intact  Cognition:  WNL  Sleep:  Number of Hours: 6.75   Treatment Plan Summary: MDD (major depressive disorder), single episode, severe , no psychosis (HCC) greatly improving, may discharge soon if she continues to improve:   Continue Trazodone 50mg  po qhs prn insomnia (pt does not desire any other meds at this time and is doing very well)   Beau FannyWithrow, Klover Priestly C, FNP 09/27/2016, 10:09 AM

## 2016-09-27 NOTE — Progress Notes (Signed)
Adult Psychoeducational Group Note  Date:  09/27/2016 Time:  8:51 PM  Group Topic/Focus:  Wrap-Up Group:   The focus of this group is to help patients review their daily goal of treatment and discuss progress on daily workbooks.   Participation Level:  Active  Participation Quality:  Appropriate  Affect:  Appropriate  Cognitive:  Alert  Insight: Appropriate  Engagement in Group:  Engaged  Modes of Intervention:  Discussion  Additional Comments:  Pt stated that her goal is to stay positive and learn some coping skills to use at home. Pt stated that she has a discharge plan in place and she is happy about that.  Kaleen OdeaCOOKE, Rehaan Viloria R 09/27/2016, 8:51 PM

## 2016-09-27 NOTE — BHH Group Notes (Signed)
[facility] Group Notes:  (Nursing/MHT/Case Management/Adjunct)  Date:  09/27/2016  Time:  9:58 AM  Type of Therapy:  Nurse Education  Participation Level:  Active  Participation Quality:  Appropriate and Sharing  Affect:  Appropriate  Cognitive:  Alert and Appropriate  Insight:  Appropriate and Good  Engagement in Group:  Engaged  Modes of Intervention:  Discussion and Problem-solving  Summary of Progress/Problems: Patient was able to report her goal today as; "leave today". She was able to identify a negative thought pattern and develop a positive sentence to replace it with. Patient was also able to identify 2 positive attributes of herself.   Almira Barenny G Ovie Cornelio 09/27/2016, 9:58 AM

## 2016-09-28 DIAGNOSIS — F4323 Adjustment disorder with mixed anxiety and depressed mood: Principal | ICD-10-CM

## 2016-09-28 DIAGNOSIS — Z9884 Bariatric surgery status: Secondary | ICD-10-CM

## 2016-09-28 MED ORDER — TRAZODONE HCL 50 MG PO TABS: 50.0000 mg | ORAL_TABLET | Freq: Every evening | ORAL | 0 refills | Status: DC | PRN

## 2016-09-28 MED ORDER — HYDROXYZINE HCL 25 MG PO TABS: 25.0000 mg | ORAL_TABLET | Freq: Four times a day (QID) | ORAL | 0 refills | Status: DC | PRN

## 2016-09-28 NOTE — BHH Group Notes (Signed)
[facility] Group Notes:  (Nursing/MHT/Case Management/Adjunct)  Date:  09/28/2016  Time:  115 PM   Type of Therapy:  Nurse Education  /  Life SKills :  The group focuses on teaching patients how to identify their needs and then hwo to develop the healthy skills needed to get their needs met.   Participation Level:  Active  Participation Quality:  Attentive  Affect:  Appropriate  Cognitive:  Alert  Insight:  Improving  Engagement in Group:  Engaged  Modes of Intervention:  Education  Summary of Progress/Problems:  Elizabeth Waller 09/28/2016, 11:06 AM

## 2016-09-28 NOTE — BHH Group Notes (Signed)
[facility] Group Notes:  (Nursing/MHT/Case Management/Adjunct)  Date:  09/28/2016  Time:  0930  Type of Therapy:  Nurse Education - Healthy Support Systems  Participation Level:  Active  Participation Quality:  Appropriate  Affect:  Appropriate  Cognitive:  Appropriate  Insight:  Good  Engagement in Group:  Engaged  Modes of Intervention:  Discussion, Education and Support  Summary of Progress/Problems: Patient identifies her family and best friend as a healthy support system.   Lawrence MarseillesFriedman, Aadarsh Cozort Eakes 09/28/2016, 10:13 AM

## 2016-09-28 NOTE — Progress Notes (Signed)
Patient verbalizes readiness for discharge. Follow up plan explained, Rx's offered but patient declined as she has not taken meds while here. Nol belongings stored on admit nor received while here. Patient verbalizes understanding. Denies SI/HI and assures this Clinical research associatewriter she will seek assistance should that change. Patient discharged ambulatory and in stable condition to family.

## 2016-09-28 NOTE — Progress Notes (Signed)
  [facility] :  Will you be returning to the same living situation after discharge:  Yes,  home with family At discharge, do you have transportation home?: Yes,  family Do you have the ability to pay for your medications: Yes,  insurance and income  Release of information consent forms completed and in the chart;  Patient's signature needed at discharge.  Patient to Follow up at: Follow-up Information    [facility] .   Why:  Social worker will call you at [phone] within 2 days of discharge to with therapy appointment information. Contact information: [address] [place], [place] [address] Phone: [phone]           Next level of care provider [facility] Link:no  Safety Planning and Suicide Prevention discussed: Yes,  with patient only, as she declined with spouse  Have you used any form of tobacco in the last 30 days? (Cigarettes, Smokeless Tobacco, Cigars, and/or Pipes): No  Has patient been referred to the Quitline?: N/A patient is not a smoker  Patient has been referred for addiction treatment: N/A  Elizabeth Waller 09/28/2016, 1:33 PM

## 2016-09-28 NOTE — [facility] Suicide Risk Assessment (Signed)
[facility]   Principal Problem: Adjustment disorder with mixed anxiety and depressed mood Discharge Diagnoses:  Patient Active Problem List   Diagnosis Date Noted  . Adjustment disorder with mixed anxiety and depressed mood [F43.23] 09/28/2016  . History of bariatric surgery [Z98.84] 09/28/2016    Total Time spent with patient: 30 minutes  Musculoskeletal: Strength & Muscle Tone: within normal limits Gait & Station: normal Patient leans: N/A  Psychiatric Specialty Exam: Review of Systems  Psychiatric/Behavioral: Negative.   All other systems reviewed and are negative.   Blood pressure 117/78, pulse 71, temperature 98.5 F (36.9 C), temperature source Oral, resp. rate 18, height 5\' 7"  (1.702 m), weight 79.4 kg (175 lb), SpO2 100 %.Body mass index is 27.41 kg/m.  General Appearance: Casual  Eye Contact::  Fair  Speech:  Clear and Coherent409  Volume:  Normal  Mood:  Euthymic  Affect:  Appropriate  Thought Process:  Goal Directed and Descriptions of Associations: Intact  Orientation:  Full (Time, Place, and Person)  Thought Content:  Logical  Suicidal Thoughts:  No  Homicidal Thoughts:  No  Memory:  Immediate;   Fair Recent;   Fair Remote;   Fair  Judgement:  Fair  Insight:  Fair  Psychomotor Activity:  Normal  Concentration:  Fair  Recall:  Fis[employer]Fair  Fund of Knowledge:Fair  Language: Fair  Akathisia:  No  Handed:  Right  AIMS (if indicated):     Assets:  Communication Skills Desire for Improvement  Sleep:  Number of Hours: 7.75  Cognition: WNL  ADL's:  Intact   Mental Status Per Nursing Assessment::   On Admission:     Demographic Factors:  Caucasian  Loss Factors: NA  Historical Factors: Impulsivity  Risk Reduction Factors:   Positive social support  Continued Clinical Symptoms:  Has work related stress - will continue to pursue psychotherapy on an out patient basis.  Cognitive Features That Contribute To Risk:  None     Suicide Risk:  Minimal: No identifiable suicidal ideation.  Patients presenting with no risk factors but with morbid ruminations; may be classified as minimal risk based on the severity of the depressive symptoms  Follow-up Information    [facility] .   Why:  Social worker will call you at [phone] within 2 days of discharge to with therapy appointment information. Contact information: [address] [place], [place] [address] Phone: [phone]           Plan Of Care/Follow-up recommendations:  Activity:  no restrictions Diet:  regular Other:  follow up with aftercare  Elizabeth Macconnell, MD 09/28/2016, 8:48 AM

## 2016-09-28 NOTE — BHH Group Notes (Signed)
Adult Therapy Group Note  Date: 09/28/2016  Time:  10:00-11:00AM  Group Topic/Focus: Healthy Press photographerupport Systems   Building Self Esteem:   The focus of this group was to assist patients in identifying their current healthy supports as well as to list and discuss other supports that can be put in place to help with achieving life goals.  An activity allowed small groups to discuss different supports, their pros and cons, and how to get started with that type of support.  These items included supports such as 12-step groups, individual therapy, psychiatrists, children, faith activities, accountability partner, sponsor, group therapy, support groups, classes on mental health, phone apps/YouTube, gym/exercise, and more.  Songs were  played at the beginning and at the end of group with a short discussion about the emotions evoked as well as how to use music as an additional support/coping mechanism.   Participation Level:  Active  Participation Quality:  Attentive and Sharing  Affect:  Appropriate  Cognitive:  Appropriate  Insight: Good  Engagement in Group:  Engaged  Modes of Intervention:  Activity, Discussion and Support  Additional Comments:  The patient expressed that a current health support is her family and her best friend.  She participated very well in the activities, and left early, presumably to be discharged.  Lynnell ChadMareida J Grossman-Orr, LCSW 09/28/2016  1:25 PM

## 2016-09-28 NOTE — Discharge Summary (Signed)
Physician Discharge Summary Note  Patient:  Elizabeth Waller is an 45 y.o., female MRN:  [id number] DOB:  01/17/1971 Patient phone:  [phone] (home)  Patient address:   [address] [place] [address],  Total Time spent with patient: 30 minutes  Date of Admission:  09/25/2016 Date of Discharge: 09/28/2016  Reason for Admission:PER H&P- 45 year old female. Reports she has been under a lot of stress regarding her work. States that after many years of good performance at work, her boss has lately been questioning her work and recently  put her on a probationary status. This has contributed to worsening depression, and states " I feel I got to my breaking point " yesterday.  She states she has been feeling depressed, sad, feeling very stressed related to her work related stressors - reports that a couple of days ago her boss gave her further negative feedback in spite of her best efforts, resulting in increased depression, with thoughts of crashing car or jumping off a height. States she had not been having suicidal ideations before. She contacted her job's EAP and was encouraged to come to hospital.  Principal Problem: Adjustment disorder with mixed anxiety and depressed mood Discharge Diagnoses: Patient Active Problem List   Diagnosis Date Noted  . Adjustment disorder with mixed anxiety and depressed mood [F43.23] 09/28/2016  . History of bariatric surgery [Z98.84] 09/28/2016    Past Psychiatric History:  Past Medical History:  Past Medical History:  Diagnosis Date  . Endometriosis   . GERD (gastroesophageal reflux disease)   . Sinus trouble     Past Surgical History:  Procedure Laterality Date  . ABLATION     for scar tissue and endometriosis  . ANKLE FRACTURE SURGERY    . CESAREAN SECTION    . GASTRECTOMY    . SINUS EXPLORATION     Family History: History reviewed. No pertinent family history. Family Psychiatric  History:  Social History:  History  Alcohol  Use No     History  Drug Use No    Social History   Social History  . Marital status: Married    Spouse name: N/A  . Number of children: N/A  . Years of education: N/A   Social History Main Topics  . Smoking status: Never Smoker  . Smokeless tobacco: Never Used  . Alcohol use No  . Drug use: No  . Sexual activity: Yes    Birth control/ protection: Surgical   Other Topics Concern  . None   Social History Narrative  . None    Hospital Course:  Elizabeth Waller was admitted for Adjustment disorder with mixed anxiety and depressed mood  and crisis management.  Pt was treated discharged with the medications listed below under Medication List.  Medical problems were identified and treated as needed.  Home medications were restarted as appropriate.  Improvement was monitored by observation and Elizabeth Waller 's daily report of symptom reduction.  Emotional and mental status was monitored by daily self-inventory reports completed by Elizabeth Waller and clinical staff.         Elizabeth Waller was evaluated by the treatment team for stability and plans for continued recovery upon discharge. Elizabeth Waller 's motivation was an integral factor for scheduling further treatment. Employment, transportation, bed availability, health status, family support, and any pending legal issues were also considered during hospital stay. Pt was offered further treatment options upon discharge including but not limited to Residential, Intensive Outpatient, and Outpatient treatment.  Elizabeth BradfordKimberly  Darel Waller will follow up with the services as listed below under Follow Up Information.     Upon completion of this admission the patient was both mentally and medically stable for discharge denying suicidal/homicidal ideation, auditory/visual/tactile hallucinations, delusional thoughts and paranoia.    Elizabeth Waller responded well to treatment with trazodone and group sessions without adverse  effects.  Pt demonstrated improvement without reported or observed adverse effects to the point of stability appropriate for outpatient management. Pertinent labs include:  for which outpatient follow-up is necessary for lab recheck as mentioned below. Reviewed CBC, CMP, BAL, and UDS; all unremarkable aside from noted exceptions.   Physical Findings: AIMS: Facial and Oral Movements Muscles of Facial Expression: None, normal Lips and Perioral Area: None, normal Jaw: None, normal Tongue: None, normal,Extremity Movements Upper (arms, wrists, hands, fingers): None, normal Lower (legs, knees, ankles, toes): None, normal, Trunk Movements Neck, shoulders, hips: None, normal, Overall Severity Severity of abnormal movements (highest score from questions above): None, normal Incapacitation due to abnormal movements: None, normal Patient's awareness of abnormal movements (rate only patient's report): No Awareness, Dental Status Current problems with teeth and/or dentures?: No Does patient usually wear dentures?: No  CIWA:    COWS:     Musculoskeletal: Strength & Muscle Tone: within normal limits Gait & Station: normal Patient leans: N/A  Psychiatric Specialty Exam: Physical Exam  Nursing note and vitals reviewed. Constitutional: She is oriented to person, place, and time. She appears well-developed.  Cardiovascular: Normal rate.   Neurological: She is alert and oriented to person, place, and time.  Psychiatric: She has a normal mood and affect. Her behavior is normal.    Review of Systems  Psychiatric/Behavioral: Negative for depression (stable) and suicidal ideas. The patient is not nervous/anxious (stable).     Blood pressure 117/78, pulse 71, temperature 98.5 F (36.9 C), temperature source Oral, resp. rate 18, height 5\' 7"  (1.702 m), weight 79.4 kg (175 lb), SpO2 100 %.Body mass index is 27.41 kg/m.    Have you used any form of tobacco in the last 30 days? (Cigarettes, Smokeless  Tobacco, Cigars, and/or Pipes): No  Has this patient used any form of tobacco in the last 30 days? (Cigarettes, Smokeless Tobacco, Cigars, and/or Pipes) , No  Blood Alcohol level:  Lab Results  Component Value Date   ETH <5 09/25/2016    Metabolic Disorder Labs:  No results found for: HGBA1C, MPG No results found for: PROLACTIN No results found for: CHOL, TRIG, HDL, CHOLHDL, VLDL, LDLCALC  See Psychiatric Specialty Exam and Suicide Risk Assessment completed by Attending Physician prior to discharge.  Discharge destination:  Home  Is patient on multiple antipsychotic therapies at discharge:  No   Has Patient had three or more failed trials of antipsychotic monotherapy by history:  No  Recommended Plan for Multiple Antipsychotic Therapies: NA  Discharge Instructions    Diet - low sodium heart healthy    Complete by:  As directed    Discharge instructions    Complete by:  As directed    Take all medications as prescribed. Keep all follow-up appointments as scheduled.  Do not consume alcohol or use illegal drugs while on prescription medications. Report any adverse effects from your medications to your primary care provider promptly.  In the event of recurrent symptoms or worsening symptoms, call 911, a crisis hotline, or go to the nearest emergency department for evaluation.   Increase activity slowly    Complete by:  As directed  Medication List    STOP taking these medications   multivitamin with minerals Tabs tablet     TAKE these medications     Indication  hydrOXYzine 25 MG tablet Commonly known as:  ATARAX/VISTARIL Take 1 tablet (25 mg total) by mouth every 6 (six) hours as needed for anxiety.  Indication:  Anxiety Neurosis   omeprazole 20 MG capsule Commonly known as:  PRILOSEC Take 20 mg by mouth 2 (two) times daily.  Indication:  Gastroesophageal Reflux Disease   traZODone 50 MG tablet Commonly known as:  DESYREL Take 1 tablet (50 mg total) by  mouth at bedtime as needed for sleep.  Indication:  Trouble Sleeping   ursodiol 300 MG capsule Commonly known as:  ACTIGALL Take 300 mg by mouth 2 (two) times daily.  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  Indication:  low vit d levl      Follow-up Information    [facility] .   Why:  Social worker will call you at [phone] within 2 days of discharge to with therapy appointment information. Contact information: [address] [place], [place] [address] Phone: [phone]           Follow-up recommendations:  Activity:  as tolerated Diet:  heart healthy  Comments:  Take all medications as prescribed. Keep all follow-up appointments as scheduled.  Do not consume alcohol or use illegal drugs while on prescription medications. Report any adverse effects from your medications to your primary care provider promptly.  In the event of recurrent symptoms or worsening symptoms, call 911, a crisis hotline, or go to the nearest emergency department for evaluation.     Signed  Beau Fanny NP  09/28/16   Patient was seen face to face for psychiatric evaluation, suicide risk assessment and case discussed with treatment team and NP and made appropriate disposition plans. Reviewed the information documented and agree with the treatment plan.   09/28/16 1:32 pm Jomarie Longs ,MD [facility]        Yomara Toothman, MD 09/28/2016, 1:31 PM

## 2016-09-28 NOTE — Progress Notes (Signed)
D: Patient up and visible in the milieu. Spoke with patient 1:1. Rates sleep as good, appetite as good, energy as normal and concentration as good.Patient's affect appropriate, mood pleasant. Rating depression, hopelessness and anxiety at a 0/10. States goal for today is to discharge. Denies pain, physical problems.   A: Patient on no scheduled meds, no prns requested or needed. Emotional support offered and self inventory reviewed. Encouraged completion of Suicide Safety Plan. Discussed POC with MD, SW.   R: Patient verbalizes understanding of POC. Patient denies SI/HI and remains safe on level III obs.

## 2016-09-30 NOTE — Progress Notes (Signed)
  [facility] Discharge [facility] :  Will you be returning to the same living situation after discharge:  Yes,  home with family At discharge, do you have transportation home?: Yes,  family Do you have the ability to pay for your medications: Yes,  insurance and income  Release of information consent forms completed and in the chart;  Patient's signature needed at discharge.  Patient to Follow up at: Follow-up Information    [facility] Follow up on 10/08/2016.   Why:  [facility] Please arrive at 1:30pm. Bring insurance card and call office if you need to reschedule.  Contact information: [address],  [place], [place] [address]  Phone: [phone]          Next level of care provider has [facility] to [facility] Link:no  Safety Planning and Suicide Prevention discussed: Yes,  with patient only, as she declined with spouse  Have you used any form of tobacco in the last 30 days? (Cigarettes, Smokeless Tobacco, Cigars, and/or Pipes): No  Has patient been referred to the Quitline?: N/A patient is not a smoker  Patient has been referred for addiction treatment: N/A  Camaron Cammack L Luisa Louk 09/30/2016, 1:27 PM

## 2020-07-12 DIAGNOSIS — R591 Generalized enlarged lymph nodes: Secondary | ICD-10-CM | POA: Insufficient documentation

## 2020-11-06 ENCOUNTER — Other Ambulatory Visit: Payer: Self-pay

## 2020-11-06 ENCOUNTER — Encounter (HOSPITAL_BASED_OUTPATIENT_CLINIC_OR_DEPARTMENT_OTHER): Payer: Self-pay

## 2020-11-06 ENCOUNTER — Emergency Department (HOSPITAL_BASED_OUTPATIENT_CLINIC_OR_DEPARTMENT_OTHER)
Admission: EM | Admit: 2020-11-06 | Discharge: 2020-11-06 | Disposition: A | Discharge status: Home / Self Care | Payer: BC Managed Care – PPO | Attending: Emergency Medicine | Admitting: Emergency Medicine

## 2020-11-06 DIAGNOSIS — R1013 Epigastric pain: Secondary | ICD-10-CM | POA: Diagnosis present

## 2020-11-06 DIAGNOSIS — K219 Gastro-esophageal reflux disease without esophagitis: Secondary | ICD-10-CM | POA: Diagnosis not present

## 2020-11-06 LAB — CBC
HCT: 38.8 % (ref 36.0–46.0)
Hemoglobin: 12.6 g/dL (ref 12.0–15.0)
MCH: 26.4 pg (ref 26.0–34.0)
MCHC: 32.5 g/dL (ref 30.0–36.0)
MCV: 81.2 fL (ref 80.0–100.0)
Platelets: 391 10*3/uL (ref 150–400)
RBC: 4.78 MIL/uL (ref 3.87–5.11)
RDW: 14 % (ref 11.5–15.5)
WBC: 8.3 10*3/uL (ref 4.0–10.5)
nRBC: 0 % (ref 0.0–0.2)

## 2020-11-06 LAB — COMPREHENSIVE METABOLIC PANEL
ALT: 13 U/L (ref 0–44)
AST: 15 U/L (ref 15–41)
Albumin: 4.3 g/dL (ref 3.5–5.0)
Alkaline Phosphatase: 43 U/L (ref 38–126)
Anion gap: 11 (ref 5–15)
BUN: 23 mg/dL — ABNORMAL HIGH (ref 6–20)
CO2: 28 mmol/L (ref 22–32)
Calcium: 9 mg/dL (ref 8.9–10.3)
Chloride: 100 mmol/L (ref 98–111)
Creatinine, Ser: 0.9 mg/dL (ref 0.44–1.00)
GFR, Estimated: >60 mL/min (ref >60)
Glucose, Bld: 98 mg/dL (ref 70–99)
Potassium: 3.6 mmol/L (ref 3.5–5.1)
Sodium: 139 mmol/L (ref 135–145)
Total Bilirubin: 0.9 mg/dL (ref 0.3–1.2)
Total Protein: 7.2 g/dL (ref 6.5–8.1)

## 2020-11-06 LAB — LIPASE, BLOOD: Lipase: 26 U/L (ref 11–51)

## 2020-11-06 LAB — URINALYSIS, ROUTINE W REFLEX MICROSCOPIC
Bilirubin Urine: NEGATIVE
Glucose, UA: NEGATIVE mg/dL
Hgb urine dipstick: NEGATIVE
Ketones, ur: NEGATIVE mg/dL
Leukocytes,Ua: NEGATIVE
Nitrite: NEGATIVE
Protein, ur: NEGATIVE mg/dL
Specific Gravity, Urine: 1.025 (ref 1.005–1.030)
pH: 6 (ref 5.0–8.0)

## 2020-11-06 LAB — PREGNANCY, URINE: Preg Test, Ur: NEGATIVE

## 2020-11-06 MED ORDER — ALUM & MAG HYDROXIDE-SIMETH 200-200-20 MG/5ML PO SUSP
30.0000 mL | Freq: Once | ORAL | Status: AC
  Administered 2020-11-06: 30 mL via ORAL
  Filled 2020-11-06: qty 30

## 2020-11-06 NOTE — Discharge Instructions (Signed)
Try pepcid or tagamet up to twice a day.  Try to avoid things that may make this worse, most commonly these are spicy foods tomato based products fatty foods chocolate and peppermint.  Alcohol and tobacco can also make this worse.  Return to the emergency department for sudden worsening pain fever or inability to eat or drink.  

## 2020-11-06 NOTE — ED Notes (Signed)
MD looked at EKG. Need to repeat d/t not crossing over on the computer.

## 2020-11-06 NOTE — ED Triage Notes (Signed)
Upper abdominal pain, constipation, and leg cramps x2 days. Tried stool softener at home and self disimpaction at home with some success.

## 2020-11-06 NOTE — ED Provider Notes (Signed)
[facility] EMERGENCY DEPARTMENT Provider Note   CSN: [id number] Arrival date & time: 11/06/20  1919     History Chief Complaint  Patient presents with  . Abdominal Pain    Elizabeth Waller is a 49 y.o. female.  49 yo F with a chief complaints of epigastric abdominal pain.  This occurred this afternoon while she was at work.  Nothing seems to make it better or worse.  No nausea no vomiting no fevers.  Has had limited intake today.  Consisting mostly of snacks because she was being trained at work.  She has been having cramps to her lower legs over the past couple days usually occurring at night.  Prior gastric sleeve procedure.  The history is provided by the patient.  Abdominal Pain Pain location:  Epigastric Pain quality: aching   Pain radiates to:  Does not radiate Pain severity:  Moderate Onset quality:  Gradual Duration:  6 hours Timing:  Constant Progression:  Worsening Chronicity:  New Associated symptoms: no chest pain, no chills, no dysuria, no fever, no nausea, no shortness of breath and no vomiting        Past Medical History:  Diagnosis Date  . Endometriosis   . GERD (gastroesophageal reflux disease)   . Sinus trouble     Patient Active Problem List   Diagnosis Date Noted  . Adjustment disorder with mixed anxiety and depressed mood 09/28/2016  . History of bariatric surgery 09/28/2016    Past Surgical History:  Procedure Laterality Date  . ABLATION     for scar tissue and endometriosis  . ANKLE FRACTURE SURGERY    . CESAREAN SECTION    . GASTRECTOMY    . SINUS EXPLORATION       OB History   No obstetric history on file.     History reviewed. No pertinent family history.  Social History   Tobacco Use  . Smoking status: Never Smoker  . Smokeless tobacco: Never Used  Substance Use Topics  . Alcohol use: No  . Drug use: No    Home Medications Prior to Admission medications   Medication Sig Start Date End Date Taking?  Authorizing Provider  hydrochlorothiazide (MICROZIDE) 12.5 MG capsule Take 37.5 mg by mouth daily.   Yes [provider]  Cholecalciferol (VITAMIN D3) 1000 units CAPS Take 1,000 Units by mouth daily.    [provider]  hydrOXYzine (ATARAX/VISTARIL) 25 MG tablet Take 1 tablet (25 mg total) by mouth every 6 (six) hours as needed for anxiety. 09/28/16   Oneta Rack, NP  omeprazole (PRILOSEC) 20 MG capsule Take 20 mg by mouth 2 (two) times daily. 03/12/16   [provider]  traZODone (DESYREL) 50 MG tablet Take 1 tablet (50 mg total) by mouth at bedtime as needed for sleep. 09/28/16   Oneta Rack, NP  ursodiol (ACTIGALL) 300 MG capsule Take 300 mg by mouth 2 (two) times daily.    [provider]    Allergies    Patient has no known allergies.  Review of Systems   Review of Systems  Constitutional: Negative for chills and fever.  HENT: Negative for congestion and rhinorrhea.   Eyes: Negative for redness and visual disturbance.  Respiratory: Negative for shortness of breath and wheezing.   Cardiovascular: Negative for chest pain and palpitations.  Gastrointestinal: Positive for abdominal pain. Negative for nausea and vomiting.  Genitourinary: Negative for dysuria and urgency.  Musculoskeletal: Negative for arthralgias and myalgias.  Skin: Negative for pallor  and wound.  Neurological: Negative for dizziness and headaches.    Physical Exam Updated Vital Signs BP 136/90   Pulse 78   Temp 98.8 F (37.1 C) (Oral)   Resp 16   Ht 5\' 7"  (1.702 m)   Wt 81.6 kg   SpO2 99%   BMI 28.19 kg/m   Physical Exam Vitals and nursing note reviewed.  Constitutional:      General: She is not in acute distress.    Appearance: She is well-developed and well-nourished. She is not diaphoretic.  HENT:     Head: Normocephalic and atraumatic.  Eyes:     Extraocular Movements: EOM normal.     Pupils: Pupils are equal, round, and reactive to light.   Cardiovascular:     Rate and Rhythm: Normal rate and regular rhythm.     Heart sounds: No murmur heard. No friction rub. No gallop.   Pulmonary:     Effort: Pulmonary effort is normal.     Breath sounds: No wheezing or rales.  Abdominal:     General: There is no distension.     Palpations: Abdomen is soft.     Tenderness: There is no abdominal tenderness.     Comments: Benign abdominal exam.  No pain to the right upper quadrant.  Negative Murphy sign.  Musculoskeletal:        General: No tenderness or edema.     Cervical back: Normal range of motion and neck supple.  Skin:    General: Skin is warm and dry.  Neurological:     Mental Status: She is alert and oriented to person, place, and time.  Psychiatric:        Mood and Affect: Mood and affect normal.        Behavior: Behavior normal.     ED Results / Procedures / Treatments   Labs (all labs ordered are listed, but only abnormal results are displayed) Labs Reviewed  COMPREHENSIVE METABOLIC PANEL - Abnormal; Notable for the following components:      Result Value   BUN 23 (*)    All other components within normal limits  LIPASE, BLOOD  CBC  URINALYSIS, ROUTINE W REFLEX MICROSCOPIC  PREGNANCY, URINE    EKG EKG Interpretation  Date/Time:  Tuesday November 06 2020 19:51:12 EST Ventricular Rate:  80 PR Interval:  170 QRS Duration: 76 QT Interval:  402 QTC Calculation: 463 R Axis:   -32 Text Interpretation: Normal sinus rhythm Left axis deviation Anterior infarct , age undetermined Abnormal ECG No significant change since last tracing Confirmed by 04-24-2002 [phone]) on 11/06/2020 9:56:35 PM   Radiology No results found.  Procedures Procedures (including critical care time)  Medications Ordered in ED Medications  alum & mag hydroxide-simeth (MAALOX/MYLANTA) 200-200-20 MG/5ML suspension 30 mL (30 mLs Oral Given 11/06/20 2210)    ED Course  I have reviewed the triage vital signs and the nursing  notes.  Pertinent labs & imaging results that were available during my care of the patient were reviewed by me and considered in my medical decision making (see chart for details).    MDM Rules/Calculators/A&P                          49 yo F with a chief complaints of epigastric abdominal pain.  This occurred while she was at work earlier.  Nothing seems to make it better or worse.  No significant pain on exam.  LFTs and lipase  are unremarkable.  No significant leukocytosis.  UA is negative for infection.  Will discharge the patient home.  Trial of H2 blockers.  10:45 PM:  I have discussed the diagnosis/risks/treatment options with the patient and believe the pt to be eligible for discharge home to follow-up with pcp. We also discussed returning to the ED immediately if new or worsening sx occur. We discussed the sx which are most concerning (e.g., sudden worsening pain, fever, inability to tolerate by mouth) that necessitate immediate return. Medications administered to the patient during their visit and any new prescriptions provided to the patient are listed below.  Medications given during this visit Medications  alum & mag hydroxide-simeth (MAALOX/MYLANTA) 200-200-20 MG/5ML suspension 30 mL (30 mLs Oral Given 11/06/20 2210)     The patient appears reasonably screen and/or stabilized for discharge and I doubt any other medical condition or other [facility] requiring further screening, evaluation, or treatment in the ED at this time prior to discharge.   Final Clinical Impression(s) / ED Diagnoses Final diagnoses:  Epigastric abdominal pain    Rx / DC Orders ED Discharge Orders    None       Melene Plan, DO 11/06/20 2245

## 2021-02-25 ENCOUNTER — Ambulatory Visit (INDEPENDENT_AMBULATORY_CARE_PROVIDER_SITE_OTHER): Payer: BC Managed Care – PPO

## 2021-02-25 ENCOUNTER — Ambulatory Visit (INDEPENDENT_AMBULATORY_CARE_PROVIDER_SITE_OTHER): Payer: BC Managed Care – PPO | Admitting: Podiatry

## 2021-02-25 ENCOUNTER — Other Ambulatory Visit: Payer: Self-pay

## 2021-02-25 DIAGNOSIS — M21621 Bunionette of right foot: Secondary | ICD-10-CM | POA: Diagnosis not present

## 2021-02-25 DIAGNOSIS — M19072 Primary osteoarthritis, left ankle and foot: Secondary | ICD-10-CM | POA: Diagnosis not present

## 2021-02-25 DIAGNOSIS — M2041 Other hammer toe(s) (acquired), right foot: Secondary | ICD-10-CM

## 2021-02-25 DIAGNOSIS — R609 Edema, unspecified: Secondary | ICD-10-CM | POA: Insufficient documentation

## 2021-02-25 DIAGNOSIS — N643 Galactorrhea not associated with childbirth: Secondary | ICD-10-CM | POA: Insufficient documentation

## 2021-02-25 NOTE — Progress Notes (Signed)
   HPI: 50 y.o. female presenting today as a new patient for evaluation of pain and sensitivity to the right forefoot.  Patient states that for the past 2-3 years she has had pain and tenderness to the right forefoot.  Gradual onset.  She denies a history of injury.  She has tried multiple conservative modalities including shoe gear modifications which have not helped to alleviate any of her pain or symptoms.    She also states that she has a history of left ankle trauma and underwent surgery in 2009.  She does have some intermittent gradual pain associated to the left foot and ankle however her main concern is her right foot today.  Past Medical History:  Diagnosis Date  . Endometriosis   . GERD (gastroesophageal reflux disease)   . Sinus trouble      Physical Exam: General: The patient is alert and oriented x3 in no acute distress.  Dermatology: Skin is warm, dry and supple bilateral lower extremities. Negative for open lesions or macerations.  Vascular: Palpable pedal pulses bilaterally. No edema or erythema noted. Capillary refill within normal limits.  Neurological: Epicritic and protective threshold grossly intact bilaterally.   Musculoskeletal Exam: Range of motion within normal limits to all pedal and ankle joints bilateral. Muscle strength 5/5 in all groups bilateral.  Splay toe deformity noted between the second and third toes right foot.  Hammertoe contracture also noted second third digit right.  Clinical evidence of a tailor's bunionette deformity with hypertrophic lateral deviation of the fifth metatarsal head also noted.  Consistent with findings on radiographic exam  Radiographic Exam:  Normal osseous mineralization. Joint spaces preserved. No fracture/dislocation/boney destruction.  Elongated second and third metatarsals noted with splay toe deformity second and third digits right foot.  Lateral deviation of the fifth metatarsal noted with an increased intermetatarsal  space  Left ankle x-rays demonstrate 2 orthopedic screws within the body of the calcaneal tubercle which appear to be intact and stable.  Degenerative changes noted to the subtalar joint.  Ankle joint appears preserved without any degenerative changes.  No acute fractures identified  Assessment: 1.  H/0 left foot trauma.  Surgery 2009. 2.  Hammertoes with splay toe deformity 2, 3 right 3.  Elongated metatarsal second and third right 4.  Tailor's bunionette deformity right   Plan of Care:  1. Patient evaluated. X-Rays reviewed.  2. Today we discussed the conservative versus surgical management of the presenting pathology. The patient opts for surgical management. All possible complications and details of the procedure were explained. All patient questions were answered. No guarantees were expressed or implied. 3. Authorization for surgery was initiated today. Surgery will consist of arthroplasty second third digit right.  Weil decompression osteotomy 2, 3 right.  Tailor's bunionectomy with fifth metatarsal osteotomy right 4.  Return to clinic 1 week postop  *Daughter, Burt, NICU RN @ [facility]    Felecia Shelling, DPM [facility]  Dr. Felecia Shelling, DPM    [address] [place], [place] [address]                Office [phone]  Fax [phone]

## 2021-03-05 ENCOUNTER — Telehealth: Payer: Self-pay | Admitting: Urology

## 2021-03-05 NOTE — Telephone Encounter (Addendum)
DOS - 03/28/21  METATARSAL OSTEOTOMY 2,3,5 RIGHT ---- 81771 HAMMER TOE REPAIR 2,3 RIGHT --- [id number]  [employer] EFFECTIVE DATE --- 11/25/19   PLAN DEDUCTIBLE - $4,000.00 W/ $3,559.00 REMAINING OUT OF POCKET - $7,000.00 W/ $6,559.00 REMAINING COINSURANCE -  0% COPAY - $0.00    SPOKE WITH SIERRA WITH [employer] AND SHE STATED THAT NO PRIOR AUTH IS REQUIRED FOR CPT CODES 03833 AND  [phone].  REF # - [id number]

## 2021-03-28 ENCOUNTER — Other Ambulatory Visit: Payer: Self-pay | Admitting: Podiatry

## 2021-03-28 ENCOUNTER — Encounter: Payer: Self-pay | Admitting: Podiatry

## 2021-03-28 DIAGNOSIS — M2041 Other hammer toe(s) (acquired), right foot: Secondary | ICD-10-CM | POA: Diagnosis not present

## 2021-03-28 DIAGNOSIS — M21541 Acquired clubfoot, right foot: Secondary | ICD-10-CM | POA: Diagnosis not present

## 2021-03-28 DIAGNOSIS — M25871 Other specified joint disorders, right ankle and foot: Secondary | ICD-10-CM | POA: Diagnosis not present

## 2021-03-28 DIAGNOSIS — M21621 Bunionette of right foot: Secondary | ICD-10-CM | POA: Diagnosis not present

## 2021-03-28 DIAGNOSIS — M25571 Pain in right ankle and joints of right foot: Secondary | ICD-10-CM | POA: Diagnosis not present

## 2021-03-28 MED ORDER — OXYCODONE-ACETAMINOPHEN 5-325 MG PO TABS: 1.0000 | ORAL_TABLET | ORAL | 0 refills | Status: DC | PRN

## 2021-03-28 MED ORDER — IBUPROFEN 800 MG PO TABS: 800.0000 mg | ORAL_TABLET | Freq: Three times a day (TID) | ORAL | 1 refills | Status: DC

## 2021-03-28 NOTE — Progress Notes (Signed)
PRN postop 

## 2021-03-29 ENCOUNTER — Telehealth: Payer: Self-pay | Admitting: Podiatry

## 2021-03-29 NOTE — Telephone Encounter (Signed)
Attempted to call the patient's daughter back to answer questions. No answer at this time. If daughter calls back please forward to triage nurse. Thanks

## 2021-03-29 NOTE — Telephone Encounter (Signed)
Pt's daughter called stating she had post op questions. Please advise.

## 2021-04-03 ENCOUNTER — Telehealth: Payer: Self-pay | Admitting: *Deleted

## 2021-04-03 ENCOUNTER — Ambulatory Visit (INDEPENDENT_AMBULATORY_CARE_PROVIDER_SITE_OTHER): Payer: BC Managed Care – PPO

## 2021-04-03 ENCOUNTER — Ambulatory Visit (INDEPENDENT_AMBULATORY_CARE_PROVIDER_SITE_OTHER): Payer: BC Managed Care – PPO | Admitting: Podiatry

## 2021-04-03 ENCOUNTER — Other Ambulatory Visit: Payer: Self-pay

## 2021-04-03 DIAGNOSIS — Z9889 Other specified postprocedural states: Secondary | ICD-10-CM

## 2021-04-03 MED ORDER — DOXYCYCLINE HYCLATE 100 MG PO TABS
100.0000 mg | ORAL_TABLET | Freq: Two times a day (BID) | ORAL | 0 refills | Status: DC
Start: 2021-04-03 — End: 2021-04-19

## 2021-04-03 NOTE — Progress Notes (Signed)
   Subjective:  Patient presents today status post hammertoe/splay toes 2, 3 right.  Tailor's bunionectomy right. DOS: 03/28/2021.  Patient states that she is doing well.  She has kept the dressings clean dry and intact and is weightbearing in the cam boot.  No new complaints at this time  Past Medical History:  Diagnosis Date  . Endometriosis   . GERD (gastroesophageal reflux disease)   . Sinus trouble       Objective/Physical Exam Neurovascular status intact.  Skin incisions appear to be well coapted with sutures and staples intact. No sign of infectious process noted. No dehiscence. No active bleeding noted. Moderate edema noted to the surgical extremity.  Mild erythema around the incision site overlying the DIPJ of the third toe right foot.  No purulence.  Radiographic Exam:  Orthopedic hardware and osteotomies sites appear to be stable with routine healing.  Good alignment of the second and third rays  Assessment: 1. s/p hammertoe/splay toe repair 2, 3 right.  Tailor's bunionectomy right. DOS: 03/28/2021   Plan of Care:  1. Patient was evaluated. X-rays reviewed 2.  Dressings changed.  Clean dry and intact x1 week 3.  There is some mild erythema around the third toe.  Prescription for doxycycline 100 mg 2 times daily #20 4.  Return to clinic in 1 week for suture and staple removal  *Daughter, [name], NICU RN @ [facility]. Son, [place], student at [employer] going into Merchant navy officer.  He was present at today's visit   Felecia Shelling, DPM [facility]  Dr. Felecia Shelling, DPM    [address] [place], [place] [address]                Office [phone]  Fax [phone]

## 2021-04-03 NOTE — Telephone Encounter (Signed)
Patient is calling because she had forgot to mention that antibiotics causes yeast infections and would a pill that will prevent this from happening. Please advise.

## 2021-04-10 ENCOUNTER — Ambulatory Visit (INDEPENDENT_AMBULATORY_CARE_PROVIDER_SITE_OTHER): Payer: BC Managed Care – PPO | Admitting: Podiatry

## 2021-04-10 ENCOUNTER — Other Ambulatory Visit: Payer: Self-pay

## 2021-04-10 DIAGNOSIS — M2041 Other hammer toe(s) (acquired), right foot: Secondary | ICD-10-CM

## 2021-04-10 DIAGNOSIS — Z9889 Other specified postprocedural states: Secondary | ICD-10-CM

## 2021-04-10 DIAGNOSIS — M19072 Primary osteoarthritis, left ankle and foot: Secondary | ICD-10-CM

## 2021-04-10 MED ORDER — OXYCODONE-ACETAMINOPHEN 5-325 MG PO TABS: 1.0000 | ORAL_TABLET | ORAL | 0 refills | Status: DC | PRN

## 2021-04-10 NOTE — Progress Notes (Signed)
   Subjective:  Patient presents today status post hammertoe/splay toes 2, 3 right.  Tailor's bunionectomy right. DOS: 03/28/2021.  Patient states that she is doing well.  She has kept the dressings clean dry and intact and is weightbearing in the cam boot.  No new complaints at this time  Past Medical History:  Diagnosis Date  . Endometriosis   . GERD (gastroesophageal reflux disease)   . Sinus trouble       Objective/Physical Exam Neurovascular status intact.  Skin incisions appear to be well coapted with sutures and staples intact. No sign of infectious process noted. No dehiscence. No active bleeding noted. Moderate edema noted to the surgical extremity.  Mild erythema around the incision site overlying the DIPJ of the third toe right foot.  No purulence.  Radiographic Exam:  Orthopedic hardware and osteotomies sites appear to be stable with routine healing.  Good alignment of the second and third rays  Assessment: 1. s/p hammertoe/splay toe repair 2, 3 right.  Tailor's bunionectomy right. DOS: 03/28/2021   Plan of Care:  1. Patient was evaluated.  2.  Staples and sutures were removed today. 3.  Patient may begin washing and showering and getting the foot wet 4.  Continue Ace wrap and cam boot 5.  Return to clinic in 3 weeks for percutaneous pin fixation removal and follow-up x-rays  *Daughter, [name], NICU RN @ [facility]. Son, [name], student at [employer] going into Merchant navy officer.  Will be transferring here in the fall.     Felecia Shelling, DPM [facility]  Dr. Felecia Shelling, DPM    [address] [place], [place] [address]                Office [phone]  Fax [phone]

## 2021-04-11 ENCOUNTER — Telehealth: Payer: Self-pay | Admitting: Podiatry

## 2021-04-11 ENCOUNTER — Other Ambulatory Visit: Payer: Self-pay | Admitting: Podiatry

## 2021-04-11 MED ORDER — HYDROCODONE-ACETAMINOPHEN 5-325 MG PO TABS: 1.0000 | ORAL_TABLET | Freq: Four times a day (QID) | ORAL | 0 refills | Status: DC | PRN

## 2021-04-11 NOTE — Telephone Encounter (Signed)
Patient called stating the insurance will not cover the prescription for Percocet and has requested another prescription that they will cover, Please Advise   Added on call provider due to provider being in surgery today

## 2021-04-11 NOTE — Telephone Encounter (Signed)
Ok thanks I informed patient

## 2021-04-11 NOTE — Telephone Encounter (Signed)
I can send her Vicodin, will see if they will cover that. If there is another issue, should call the pharmacist to see what the exact issue is (often it's a matter of # and quantity with opioid restriction laws)

## 2021-04-11 NOTE — Telephone Encounter (Signed)
I just sent in vicodin

## 2021-04-11 NOTE — Progress Notes (Signed)
PRN postop 

## 2021-04-17 ENCOUNTER — Telehealth: Payer: Self-pay | Admitting: Podiatry

## 2021-04-17 NOTE — Telephone Encounter (Signed)
Patient called our office stating her DOS which was 5/5//22 and her pin was moving out of her foot , she is having pain. Renea Ee spoke Mrs.Mewborn and advise her to come to our [place] office to be seen 04/19/21 - appt schedule

## 2021-04-18 NOTE — Telephone Encounter (Signed)
Patient is scheduled 04/19/21

## 2021-04-19 ENCOUNTER — Encounter: Payer: Self-pay | Admitting: Podiatry

## 2021-04-19 ENCOUNTER — Ambulatory Visit (INDEPENDENT_AMBULATORY_CARE_PROVIDER_SITE_OTHER): Payer: BC Managed Care – PPO | Admitting: Podiatry

## 2021-04-19 ENCOUNTER — Other Ambulatory Visit: Payer: Self-pay

## 2021-04-19 VITALS — Temp 99.1°F

## 2021-04-19 DIAGNOSIS — Z9889 Other specified postprocedural states: Secondary | ICD-10-CM

## 2021-04-19 MED ORDER — FLUCONAZOLE 150 MG PO TABS: 150.0000 mg | ORAL_TABLET | Freq: Once | ORAL | 0 refills | Status: AC

## 2021-04-19 MED ORDER — OXYCODONE-ACETAMINOPHEN 5-325 MG PO TABS: 1.0000 | ORAL_TABLET | ORAL | 0 refills | Status: DC | PRN

## 2021-04-19 MED ORDER — DOXYCYCLINE HYCLATE 100 MG PO TABS: 100.0000 mg | ORAL_TABLET | Freq: Two times a day (BID) | ORAL | 0 refills | Status: DC

## 2021-04-19 NOTE — Progress Notes (Signed)
   Subjective:  Patient presents today status post hammertoe/splay toes 2, 3 right.  Tailor's bunionectomy right. DOS: 03/28/2021.  Patient states that she is doing well.  Patient states that she has noticed since last visit that the pin to the right third toe is beginning to protrude out.  She was concerned and presents today for further treatment evaluation  Past Medical History:  Diagnosis Date  . Endometriosis   . GERD (gastroesophageal reflux disease)   . Sinus trouble       Objective/Physical Exam Neurovascular status intact.  Skin incisions appear to be well coapted with sutures and staples intact. No sign of infectious process noted. No dehiscence. No active bleeding noted. Moderate edema noted to the surgical extremity.   Radiographic Exam:  Orthopedic hardware and osteotomies sites appear to be stable with routine healing.  Good alignment of the second and third rays  Assessment: 1. s/p hammertoe/splay toe repair 2, 3 right.  Tailor's bunionectomy right. DOS: 03/28/2021   Plan of Care:  1. Patient was evaluated.  2.  The percutaneous portion of the fixation pin was prepped aseptically with Betadine and pressure was applied to the pin and it was pushed back in place.  Patient tolerated this well.  Dressings were applied at this time.  Keep clean dry and intact until next follow-up visit 3.  Prescription for doxycycline 100 mg 2 times daily #20 for prophylaxis 4.  Refill prescription for Percocet 5/3 2 5  mg #20 5.  Return to clinic on neck scheduled appointment, 04/29/2021  *Daughter, [name], NICU RN @ [facility]. Son, [place], student at [employer] going into Merchant navy officer.  Will be transferring here in the fall.     Felecia Shelling, DPM [facility]  Dr. Felecia Shelling, DPM    [address] [place], [place] [address]                Office [phone]  Fax [phone]

## 2021-04-19 NOTE — Addendum Note (Signed)
Addended by: Felecia Shelling on: 04/19/2021 05:27 PM   Modules accepted: Orders

## 2021-04-26 ENCOUNTER — Encounter: Payer: Self-pay | Admitting: Podiatry

## 2021-04-29 ENCOUNTER — Other Ambulatory Visit: Payer: Self-pay

## 2021-04-29 ENCOUNTER — Ambulatory Visit (INDEPENDENT_AMBULATORY_CARE_PROVIDER_SITE_OTHER): Payer: BC Managed Care – PPO | Admitting: Podiatry

## 2021-04-29 ENCOUNTER — Ambulatory Visit (INDEPENDENT_AMBULATORY_CARE_PROVIDER_SITE_OTHER): Payer: BC Managed Care – PPO

## 2021-04-29 DIAGNOSIS — Z9889 Other specified postprocedural states: Secondary | ICD-10-CM | POA: Diagnosis not present

## 2021-04-29 NOTE — Progress Notes (Addendum)
   Subjective:  Patient presents today status post hammertoe/splay toes 2, 3 right.  Tailor's bunionectomy right. DOS: 03/28/2021.  Patient states that the percutaneous pin to the right third toe came out yesterday.  She removed the pin and applied antibiotic ointment and Band-Aid over the toe.  No new complaints this time.  She is been weightbearing in the cam boot as instructed.  Past Medical History:  Diagnosis Date  . Endometriosis   . GERD (gastroesophageal reflux disease)   . Sinus trouble       Objective/Physical Exam Neurovascular status intact.  Skin incisions appear to be well coapted and healed. No sign of infectious process noted. No dehiscence. No active bleeding noted.There is some chronic edema also to the surgical toes and forefoot  Radiographic Exam:  Orthopedic hardware and osteotomies sites appear to be stable with routine healing.  The third toe does appear to be having some residual splay deformity.    Assessment: 1. s/p hammertoe/splay toe repair 2, 3 right.  Tailor's bunionectomy right. DOS: 03/28/2021   Plan of Care:  1. Patient was evaluated.  X-rays reviewed 2.  Percutaneous fixation pins were removed today from the second digit.  The percutaneous pin to the third toe came out of the patient's home yesterday. 3.  There is some residual splay between the second and third digit of the foot.  Recommend taping with 1 inch Coban daily while the foot continues to heal 4.  Postsurgical shoe dispensed.  Weightbearing as tolerated.  Patient may discontinue cam boot 5.  Compression ankle sleeve dispensed for the edema 6.  Return to clinic in 4 weeks for follow-up x-ray  *Daughter, [name], NICU RN @ [facility]. Son, [place], student at [employer] going into Merchant navy officer.  Will be transferring here in the fall.    *Mother gave me an Amoroso funnel cake today   Felecia Shelling, DPM [facility]  Dr. Felecia Shelling, DPM    [address] [place], [place] [address]                Office [phone]  Fax [phone]

## 2021-05-01 ENCOUNTER — Encounter: Payer: Self-pay | Admitting: Podiatry

## 2021-05-29 ENCOUNTER — Other Ambulatory Visit: Payer: Self-pay

## 2021-05-29 ENCOUNTER — Ambulatory Visit (INDEPENDENT_AMBULATORY_CARE_PROVIDER_SITE_OTHER): Payer: BC Managed Care – PPO

## 2021-05-29 ENCOUNTER — Ambulatory Visit (INDEPENDENT_AMBULATORY_CARE_PROVIDER_SITE_OTHER): Payer: BC Managed Care – PPO | Admitting: Podiatry

## 2021-05-29 DIAGNOSIS — Z9889 Other specified postprocedural states: Secondary | ICD-10-CM

## 2021-05-29 DIAGNOSIS — M2041 Other hammer toe(s) (acquired), right foot: Secondary | ICD-10-CM

## 2021-05-29 NOTE — Progress Notes (Signed)
   Subjective:  Patient presents today status post hammertoe/splay toes 2, 3 right.  Tailor's bunionectomy right. DOS: 03/28/2021.  Patient continues to have some pain and swelling to the surgical foot.  She has been wearing the compression ankle sleeve as instructed.  No new complaints at this time  Past Medical History:  Diagnosis Date   Endometriosis    GERD (gastroesophageal reflux disease)    Sinus trouble       Objective/Physical Exam Neurovascular status intact.  Skin incisions appear to be well coapted and healed. No sign of infectious process noted. No dehiscence. No active bleeding noted.There is some chronic edema also to the surgical toes and forefoot There is some limited range of motion with scar tissue dorsiflexion of the second and third MTPJ's  Radiographic Exam:  Orthopedic hardware and osteotomies sites appear to be stable with routine healing.  The third toe does appear to be having some residual splay deformity.    Assessment: 1. s/p hammertoe/splay toe repair 2, 3 right.  Tailor's bunionectomy right. DOS: 03/28/2021 2.  Edema surgical foot   Plan of Care:  1. Patient was evaluated.  X-rays reviewed 2.  There continues to be some edema and pain to the area.  Surgical foot still healing. 3.  Today we are going to order physical therapy to help with the swelling and edema and range of motion to the second and third toes, specifically the MTPJ's plantar flexion 4.  Recommend good supportive shoes and sneakers 5.  Continue compression ankle sleeves 6.  Return to clinic 6 weeks  *Daughter, [name], NICU RN @ [facility]. Son, [place], student at [employer] going into Merchant navy officer.  Will be transferring here in the fall.    *Mother gave me an Amoroso funnel cake today   Felecia Shelling, DPM [facility]  Dr. Felecia Shelling, DPM    [address] [place], [place] [address]                Office [phone]  Fax [phone]

## 2021-05-29 NOTE — Addendum Note (Signed)
Addended by: Francesca Oman on: 05/29/2021 10:29 AM   Modules accepted: Orders

## 2021-07-10 ENCOUNTER — Encounter: Payer: BC Managed Care – PPO | Admitting: Podiatry

## 2021-08-07 DIAGNOSIS — J069 Acute upper respiratory infection, unspecified: Secondary | ICD-10-CM | POA: Diagnosis not present

## 2021-09-04 DIAGNOSIS — R059 Cough, unspecified: Secondary | ICD-10-CM | POA: Diagnosis not present

## 2021-09-04 DIAGNOSIS — J069 Acute upper respiratory infection, unspecified: Secondary | ICD-10-CM | POA: Diagnosis not present

## 2021-09-09 DIAGNOSIS — J069 Acute upper respiratory infection, unspecified: Secondary | ICD-10-CM | POA: Diagnosis not present

## 2021-09-09 DIAGNOSIS — M791 Myalgia, unspecified site: Secondary | ICD-10-CM | POA: Diagnosis not present

## 2021-09-09 DIAGNOSIS — R059 Cough, unspecified: Secondary | ICD-10-CM | POA: Diagnosis not present

## 2021-10-29 ENCOUNTER — Encounter (HOSPITAL_COMMUNITY): Payer: Self-pay

## 2021-10-29 ENCOUNTER — Emergency Department (HOSPITAL_COMMUNITY): Payer: BC Managed Care – PPO

## 2021-10-29 ENCOUNTER — Emergency Department (HOSPITAL_COMMUNITY)
Admission: EM | Admit: 2021-10-29 | Discharge: 2021-10-29 | Disposition: A | Discharge status: Home / Self Care | Payer: BC Managed Care – PPO | Attending: Emergency Medicine | Admitting: Emergency Medicine

## 2021-10-29 ENCOUNTER — Other Ambulatory Visit: Payer: Self-pay

## 2021-10-29 DIAGNOSIS — I10 Essential (primary) hypertension: Secondary | ICD-10-CM | POA: Insufficient documentation

## 2021-10-29 DIAGNOSIS — Z1231 Encounter for screening mammogram for malignant neoplasm of breast: Secondary | ICD-10-CM | POA: Diagnosis not present

## 2021-10-29 DIAGNOSIS — R079 Chest pain, unspecified: Secondary | ICD-10-CM | POA: Diagnosis not present

## 2021-10-29 DIAGNOSIS — R002 Palpitations: Secondary | ICD-10-CM | POA: Diagnosis not present

## 2021-10-29 DIAGNOSIS — Z79899 Other long term (current) drug therapy: Secondary | ICD-10-CM | POA: Insufficient documentation

## 2021-10-29 DIAGNOSIS — Z01419 Encounter for gynecological examination (general) (routine) without abnormal findings: Secondary | ICD-10-CM | POA: Diagnosis not present

## 2021-10-29 DIAGNOSIS — Z0000 Encounter for general adult medical examination without abnormal findings: Secondary | ICD-10-CM | POA: Diagnosis not present

## 2021-10-29 DIAGNOSIS — Z124 Encounter for screening for malignant neoplasm of cervix: Secondary | ICD-10-CM | POA: Diagnosis not present

## 2021-10-29 LAB — COMPREHENSIVE METABOLIC PANEL
ALT: 13 U/L (ref 0–44)
AST: 20 U/L (ref 15–41)
Albumin: 4.6 g/dL (ref 3.5–5.0)
Alkaline Phosphatase: 54 U/L (ref 38–126)
Anion gap: 6 (ref 5–15)
BUN: 20 mg/dL (ref 6–20)
CO2: 30 mmol/L (ref 22–32)
Calcium: 9.2 mg/dL (ref 8.9–10.3)
Chloride: 103 mmol/L (ref 98–111)
Creatinine, Ser: 0.82 mg/dL (ref 0.44–1.00)
GFR, Estimated: >60 mL/min (ref >60)
Glucose, Bld: 101 mg/dL — ABNORMAL HIGH (ref 70–99)
Potassium: 3.4 mmol/L — ABNORMAL LOW (ref 3.5–5.1)
Sodium: 139 mmol/L (ref 135–145)
Total Bilirubin: 0.7 mg/dL (ref 0.3–1.2)
Total Protein: 8.1 g/dL (ref 6.5–8.1)

## 2021-10-29 LAB — TROPONIN I (HIGH SENSITIVITY): Troponin I (High Sensitivity): <2 ng/L (ref <18)

## 2021-10-29 LAB — CBC WITH DIFFERENTIAL/PLATELET
Abs Immature Granulocytes: 0.01 10*3/uL (ref 0.00–0.07)
Basophils Absolute: 0.1 10*3/uL (ref 0.0–0.1)
Basophils Relative: 1 %
Eosinophils Absolute: 0.7 10*3/uL — ABNORMAL HIGH (ref 0.0–0.5)
Eosinophils Relative: 9 %
HCT: 41.1 % (ref 36.0–46.0)
Hemoglobin: 13.1 g/dL (ref 12.0–15.0)
Immature Granulocytes: 0 %
Lymphocytes Relative: 31 %
Lymphs Abs: 2.2 10*3/uL (ref 0.7–4.0)
MCH: 25.4 pg — ABNORMAL LOW (ref 26.0–34.0)
MCHC: 31.9 g/dL (ref 30.0–36.0)
MCV: 79.7 fL — ABNORMAL LOW (ref 80.0–100.0)
Monocytes Absolute: 0.4 10*3/uL (ref 0.1–1.0)
Monocytes Relative: 6 %
Neutro Abs: 3.9 10*3/uL (ref 1.7–7.7)
Neutrophils Relative %: 53 %
Platelets: 367 10*3/uL (ref 150–400)
RBC: 5.16 MIL/uL — ABNORMAL HIGH (ref 3.87–5.11)
RDW: 14.6 % (ref 11.5–15.5)
WBC: 7.3 10*3/uL (ref 4.0–10.5)
nRBC: 0 % (ref 0.0–0.2)

## 2021-10-29 LAB — LIPASE, BLOOD: Lipase: 53 U/L — ABNORMAL HIGH (ref 11–51)

## 2021-10-29 LAB — URINALYSIS, ROUTINE W REFLEX MICROSCOPIC
Bilirubin Urine: NEGATIVE
Glucose, UA: NEGATIVE mg/dL
Ketones, ur: NEGATIVE mg/dL
Leukocytes,Ua: NEGATIVE
Nitrite: NEGATIVE
Protein, ur: NEGATIVE mg/dL
Specific Gravity, Urine: 1.02 (ref 1.005–1.030)
pH: 6.5 (ref 5.0–8.0)

## 2021-10-29 LAB — HM PAP SMEAR

## 2021-10-29 NOTE — ED Provider Notes (Signed)
[facility] DEPT Provider Note   CSN: [id number] Arrival date & time: 10/29/21  1517     History Chief Complaint  Patient presents with   Palpitations   Headache   Eye Problem   Hypertension    Elizabeth Waller is a 50 y.o. female.  Pt presents to the ED today with ED with palpitations and elevated bp.  Pt said her bp was elevated today at her doctor's office.  Pt had been on HCTZ to help with swelling, but has been out of it.  It was renewed today.  Pt also said she's had some floaters, but they only occur when she is standing up in the hot shower in the morning.  Pt does not have a hx of htn.  Pt feels fine now.  No cp or sob.      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   Annual physical exam 02/04/2016   Obesity (BMI 30-39.9) 01/24/2016   Acute bilateral low back pain without sciatica 12/17/2015   Deviated nasal septum 12/17/2015   Hyperlipidemia 12/17/2015   Hypertension 12/17/2015   Hypertrophy of nasal turbinates 12/17/2015   Hypokalemia 12/17/2015   Mitral valve prolapse 12/17/2015   Acute recurrent maxillary sinusitis 11/01/2015   Chronic maxillary sinusitis 11/01/2015   Epistaxis 11/01/2015   Nasal congestion 11/01/2015   Nasal polyps 11/01/2015    Past Surgical History:  Procedure Laterality Date   ABLATION     for scar tissue and endometriosis   ANKLE FRACTURE SURGERY     CESAREAN SECTION     GASTRECTOMY     SINUS EXPLORATION       OB History   No obstetric history on file.     No family history on  file.  Social History   Tobacco Use   Smoking status: Never   Smokeless tobacco: Never  Substance Use Topics   Alcohol use: No   Drug use: No    Home Medications Prior to Admission medications   Medication Sig Start Date End Date Taking? Authorizing Provider  doxycycline (VIBRA-TABS) 100 MG tablet Take 1 tablet (100 mg total) by mouth 2 (two) times daily. 04/19/21   Edrick Kins, DPM  hydrochlorothiazide (MICROZIDE) 12.5 MG capsule Take 37.5 mg by mouth daily.    [provider]  HYDROcodone-acetaminophen (NORCO/VICODIN) 5-325 MG tablet Take 1 tablet by mouth every 6 (six) hours as needed for moderate pain. 04/11/21   Edrick Kins, DPM  ibuprofen (ADVIL) 800 MG tablet Take 1 tablet (800 mg total) by mouth 3 (three) times daily. 03/28/21   Edrick Kins, DPM  oxyCODONE-acetaminophen (PERCOCET) 5-325 MG tablet Take 1 tablet by mouth every 4 (four) hours as needed for severe pain. 04/19/21   Edrick Kins, DPM  phentermine (ADIPEX-P) 37.5 MG tablet phentermine 37.5 mg tablet  TAKE 1 TABLET BY MOUTH ONCE DAILY IN THE MORNING    [provider]    Allergies    Patient has no known allergies.  Review of Systems   Review of Systems  Eyes:  Positive for visual  disturbance.  Cardiovascular:  Positive for palpitations.  All other systems reviewed and are negative.  Physical Exam Updated Vital Signs BP (!) 160/89 (BP Location: Right Arm)   Pulse 82   Temp 98.5 F (36.9 C) (Oral)   Resp 18   Ht 5\' 7"  (1.702 m)   Wt 92 kg   LMP 10/20/2021   SpO2 98%   BMI 31.76 kg/m   Physical Exam Vitals and nursing note reviewed.  Constitutional:      Appearance: She is well-developed. She is obese.  HENT:     Head: Normocephalic and atraumatic.     Mouth/Throat:     Mouth: Mucous membranes are moist.     Pharynx: Oropharynx is clear.  Eyes:     Extraocular Movements: Extraocular movements intact.     Pupils: Pupils are equal, round, and reactive to light.   Cardiovascular:     Rate and Rhythm: Normal rate and regular rhythm.     Heart sounds: Normal heart sounds.  Pulmonary:     Effort: Pulmonary effort is normal.     Breath sounds: Normal breath sounds.  Abdominal:     General: Bowel sounds are normal.     Palpations: Abdomen is soft.  Musculoskeletal:        General: Normal range of motion.     Cervical back: Normal range of motion and neck supple.  Skin:    General: Skin is warm.  Neurological:     Mental Status: She is alert and oriented to person, place, and time.  Psychiatric:        Mood and Affect: Mood normal.        Behavior: Behavior normal.    ED Results / Procedures / Treatments   Labs (all labs ordered are listed, but only abnormal results are displayed) Labs Reviewed  COMPREHENSIVE METABOLIC PANEL - Abnormal; Notable for the following components:      Result Value   Potassium 3.4 (*)    Glucose, Bld 101 (*)    All other components within normal limits  CBC WITH DIFFERENTIAL/PLATELET - Abnormal; Notable for the following components:   RBC 5.16 (*)    MCV 79.7 (*)    MCH 25.4 (*)    Eosinophils Absolute 0.7 (*)    All other components within normal limits  LIPASE, BLOOD - Abnormal; Notable for the following components:   Lipase 53 (*)    All other components within normal limits  URINALYSIS, ROUTINE W REFLEX MICROSCOPIC  TROPONIN I (HIGH SENSITIVITY)  TROPONIN I (HIGH SENSITIVITY)    EKG EKG Interpretation  Date/Time:  Tuesday October 29 2021 16:01:43 EST Ventricular Rate:  80 PR Interval:  133 QRS Duration: 92 QT Interval:  391 QTC Calculation: 451 R Axis:   36 Text Interpretation: Sinus rhythm Borderline low voltage, extremity leads No significant change since last tracing Confirmed by Isla Pence [phone]) on 10/29/2021 6:16:59 PM  Radiology DG Chest 2 View  Result Date: 10/29/2021 CLINICAL DATA:  Chest pain. EXAM: CHEST - 2 VIEW COMPARISON:  December 28, 2014. FINDINGS: The heart size and  mediastinal contours are within normal limits. Both lungs are clear. The visualized skeletal structures are unremarkable. IMPRESSION: No active cardiopulmonary disease. Electronically Signed   By: Marijo Conception M.D.   On: 10/29/2021 17:35    Procedures Procedures   Medications Ordered in ED Medications - No data to display  ED Course  I have reviewed the triage vital signs and the nursing notes.  Pertinent  labs & imaging results that were available during my care of the patient were reviewed by me and considered in my medical decision making (see chart for details).    MDM Rules/Calculators/A&P                           BP mildly elevated.  She has not been on the hctz in several weeks because she's been out.  Floaters are only in the hot shower, so it is likely related to vasodilation.  Pt's labs are unremarkable.  She is to keep an eye on her bp for the next few weeks and keep an eye on palpitations.  If they continue or bp stays elevated, she will need to see her pcp.  She knows to return if worse.   Final Clinical Impression(s) / ED Diagnoses Final diagnoses:  Hypertension, unspecified type  Palpitations    Rx / DC Orders ED Discharge Orders     None        Jacalyn Lefevre, MD 10/29/21 1931

## 2021-10-29 NOTE — ED Provider Notes (Signed)
Emergency Medicine Provider Triage Evaluation Note  Elizabeth Waller , a 50 y.o. female  was evaluated in triage.  Pt complains of floaters and headaches/feeling off.  She reports that the floaters have been in both eyes intermittently.  She states that the symptoms have been worse today.    She has been having palpitations in her chest worse over the past three weeks.  She reports feeling a pressure in the chest.  It feels like her heart is racing.  She feels like she is able to breathe through them.  She states that she feels like the chest pressure/palpitations are not at the same time as the floaters.    She doesn't check her blood pressure regularly but with feeling unwell took it today.    She does report that she has been having floaters.  The floaters occur every morning when she is in the shower and only when she is in the shower.  When she has these floaters happen it is in both eyes and she feels lightheaded. Review of Systems  Positive: Floaters, light headed every morning in the hot shower, palpitations, chest pressure Negative: Syncope, weakness  Physical Exam  BP (!) 154/91 (BP Location: Right Arm)   Pulse 85   Temp 98.5 F (36.9 C) (Oral)   Resp 16   Ht 5\' 7"  (1.702 m)   Wt 92 kg   SpO2 100%   BMI 31.76 kg/m  Gen:   Awake, no distress   Resp:  Normal effort  MSK:   Moves extremities without difficulty  Other:  Slight murmur, RRR.  Full EOMS bilaterally.  Normal gait.   Medical Decision Making  Medically screening exam initiated at 4:21 PM.  Appropriate orders placed.  Ashonti Leandro was informed that the remainder of the evaluation will be completed by another provider, this initial triage assessment does not replace that evaluation, and the importance of remaining in the ED until their evaluation is complete.  Patient presents today for 2 concerns.  One is palpitations. She has had these intermittently for a while now but feels like they are more frequent. She  denies any drug use.  She does have chest pressure will obtain labs.  She does report flashers/floaters, and this happens only every morning in the shower.  I am reassured that this only happens when she is taking a hot shower first thing in the morning as I suspect that this may be an element of vasodilation.  Note: Portions of this report may have been transcribed using voice recognition software. Every effort was made to ensure accuracy; however, inadvertent computerized transcription errors may be present    Royetta Asal 10/29/21 1643    14/06/22, MD 10/29/21 2309

## 2021-10-29 NOTE — ED Triage Notes (Signed)
Pt reports intermittent palpitations, chest discomfort, seeing floaters, headache, and high blood pressure over the past 3 weeks. Pt states symptoms have become more severe today.

## 2021-11-21 NOTE — Progress Notes (Signed)
Elizabeth Rings, MD Reason for referral-palpitations and hypertension  HPI: 50 year old female for evaluation of hypertension and palpitations at request of Vania Rea MD.  Patient seen recently in the emergency room with hypertension and palpitations.  Chest x-ray unrevealing and troponin I normal.  Hemoglobin 13.1 and potassium 3.4.  Liver functions normal.  TSH December 6 was normal.  Cardiology now asked to evaluate.  Patient states that on the day that she had her symptoms she was under significant amounts of stress.  She developed a sensation of her heart racing associated with some dizziness.  She felt anxious as well.  There was no chest pain or increased dyspnea.  She did not have syncope.  Her symptoms lasted 2 days and then resolved.  Since then she has done well.  She typically does not have dyspnea on exertion, orthopnea, PND, pedal edema, history of syncope or exertional chest pain.  Cardiology now asked to evaluate.  Current Outpatient Medications  Medication Sig Dispense Refill   Norethindrone-Ethinyl Estradiol-Fe Biphas (LO LOESTRIN FE) 1 MG-10 MCG / 10 MCG tablet Take 1 tablet by mouth daily.     triamterene-hydrochlorothiazide (MAXZIDE-25) 37.5-25 MG tablet Take 1 tablet by mouth daily.     No current facility-administered medications for this visit.    No Known Allergies   Past Medical History:  Diagnosis Date   Endometriosis    Hypertension    Sinus trouble     Past Surgical History:  Procedure Laterality Date   ABLATION     for scar tissue and endometriosis   ANKLE FRACTURE SURGERY     CESAREAN SECTION     FOOT SURGERY     GASTRECTOMY     SINUS EXPLORATION      Social History   Socioeconomic History   Marital status: Married    Spouse name: Not on file   Number of children: 3   Years of education: Not on file   Highest education level: Not on file  Occupational History   Not on file  Tobacco Use   Smoking status: Never   Smokeless  tobacco: Never  Substance and Sexual Activity   Alcohol use: No   Drug use: No   Sexual activity: Yes    Birth control/protection: Surgical  Other Topics Concern   Not on file  Social History Narrative   Not on file   Social Determinants of Health   Financial Resource Strain: Not on file  Food Insecurity: Not on file  Transportation Needs: Not on file  Physical Activity: Not on file  Stress: Not on file  Social Connections: Not on file  Intimate Partner Violence: Not on file    Family History  Problem Relation Age of Onset   CAD Father     ROS: no fevers or chills, productive cough, hemoptysis, dysphasia, odynophagia, melena, hematochezia, dysuria, hematuria, rash, seizure activity, orthopnea, PND, pedal edema, claudication. Remaining systems are negative.  Physical Exam:   Blood pressure 128/80, pulse 84, height 5' 7.5" (1.715 m), weight 207 lb 3.2 oz (94 kg), SpO2 99 %.  General:  Well developed/well nourished in NAD Skin warm/dry Patient not depressed No peripheral clubbing Back-normal HEENT-normal/normal eyelids Neck supple/normal carotid upstroke bilaterally; no bruits; no JVD; no thyromegaly chest - CTA/ normal expansion CV - RRR/normal S1 and S2; no murmurs, rubs or gallops;  PMI nondisplaced Abdomen -NT/ND, no HSM, no mass, + bowel sounds, no bruit 2+ femoral pulses, no bruits Ext-no edema, chords, 2+ DP Neuro-grossly nonfocal  ECG -October 29, 2021-normal sinus rhythm, left axis deviation, no ST changes.  Personally reviewed  A/P  1 palpitations-symptoms are relatively infrequent and she feels may have been related to stress.  If she has more frequent episodes in the future we will consider a monitor to further assess.  We also discussed the potential for an apple watch.  We will arrange an echocardiogram to assess LV function.  2 hypertension-her blood pressure was elevated at the time of her event that she feels is related to stress.  It is now normal.   We will continue hydrochlorothiazide.  3 obesity-she will continue efforts at weight loss.  4 history of mitral valve prolapse-we will plan echocardiogram to further assess.  Olga Millers, MD

## 2021-12-03 ENCOUNTER — Encounter: Payer: Self-pay | Admitting: Cardiology

## 2021-12-03 ENCOUNTER — Other Ambulatory Visit: Payer: Self-pay

## 2021-12-03 ENCOUNTER — Ambulatory Visit (INDEPENDENT_AMBULATORY_CARE_PROVIDER_SITE_OTHER): Payer: BC Managed Care – PPO | Admitting: Cardiology

## 2021-12-03 VITALS — BP 128/80 | HR 84 | Ht 67.5 in | Wt 207.2 lb

## 2021-12-03 DIAGNOSIS — R002 Palpitations: Secondary | ICD-10-CM | POA: Diagnosis not present

## 2021-12-03 DIAGNOSIS — I341 Nonrheumatic mitral (valve) prolapse: Secondary | ICD-10-CM | POA: Diagnosis not present

## 2021-12-03 DIAGNOSIS — I10 Essential (primary) hypertension: Secondary | ICD-10-CM

## 2021-12-03 NOTE — Patient Instructions (Signed)

## 2021-12-04 ENCOUNTER — Telehealth: Payer: Self-pay | Admitting: Cardiology

## 2021-12-04 NOTE — Telephone Encounter (Signed)
Routed to MD/RN to clarify phentermine question

## 2021-12-04 NOTE — Telephone Encounter (Signed)
Pt c/o medication issue:  1. Name of Medication:  Phentermine  2. How are you currently taking this medication (dosage and times per day)?   3. Are you having a reaction (difficulty breathing--STAT)?   4. What is your medication issue?   Elizabeth Waller with [facility] is calling to clarify medication instructions. She states she reviewed 01/10 OV notes from appointment with Dr. Stanford Breed and they conflict with what the patient is telling her. Per patient, she has been cleared to restart Phentermine. Please advise.

## 2021-12-05 NOTE — Telephone Encounter (Signed)
Left detailed voicemail for Elizabeth Waller of okay for medication.

## 2021-12-11 ENCOUNTER — Other Ambulatory Visit: Payer: Self-pay

## 2021-12-11 ENCOUNTER — Ambulatory Visit (HOSPITAL_COMMUNITY): Payer: BC Managed Care – PPO | Attending: Cardiology

## 2021-12-11 DIAGNOSIS — I10 Essential (primary) hypertension: Secondary | ICD-10-CM

## 2021-12-11 DIAGNOSIS — R002 Palpitations: Secondary | ICD-10-CM | POA: Diagnosis not present

## 2021-12-11 LAB — ECHOCARDIOGRAM COMPLETE
Area-P 1/2: 2.87 cm2
S' Lateral: 3 cm

## 2022-07-06 DIAGNOSIS — J323 Chronic sphenoidal sinusitis: Secondary | ICD-10-CM | POA: Diagnosis not present

## 2022-07-06 DIAGNOSIS — M25551 Pain in right hip: Secondary | ICD-10-CM | POA: Diagnosis not present

## 2022-07-06 DIAGNOSIS — W1789XA Other fall from one level to another, initial encounter: Secondary | ICD-10-CM | POA: Diagnosis not present

## 2022-07-06 DIAGNOSIS — S0990XA Unspecified injury of head, initial encounter: Secondary | ICD-10-CM | POA: Diagnosis not present

## 2022-07-06 DIAGNOSIS — Y999 Unspecified external cause status: Secondary | ICD-10-CM | POA: Diagnosis not present

## 2022-07-06 DIAGNOSIS — J320 Chronic maxillary sinusitis: Secondary | ICD-10-CM | POA: Diagnosis not present

## 2022-07-06 DIAGNOSIS — R55 Syncope and collapse: Secondary | ICD-10-CM | POA: Diagnosis not present

## 2022-07-07 DIAGNOSIS — R55 Syncope and collapse: Secondary | ICD-10-CM | POA: Diagnosis not present

## 2022-07-09 DIAGNOSIS — E6609 Other obesity due to excess calories: Secondary | ICD-10-CM | POA: Diagnosis not present

## 2022-07-09 DIAGNOSIS — Z6831 Body mass index (BMI) 31.0-31.9, adult: Secondary | ICD-10-CM | POA: Diagnosis not present

## 2022-07-09 DIAGNOSIS — R42 Dizziness and giddiness: Secondary | ICD-10-CM | POA: Diagnosis not present

## 2022-07-09 DIAGNOSIS — Z09 Encounter for follow-up examination after completed treatment for conditions other than malignant neoplasm: Secondary | ICD-10-CM | POA: Diagnosis not present

## 2022-09-30 IMAGING — CR DG CHEST 2V
2 series · 2 of 2 positions shown · non-contrast
Comparison: December 28, 2014.

CLINICAL DATA: Chest pain.

EXAM:
CHEST - 2 VIEW

[w chest pa]
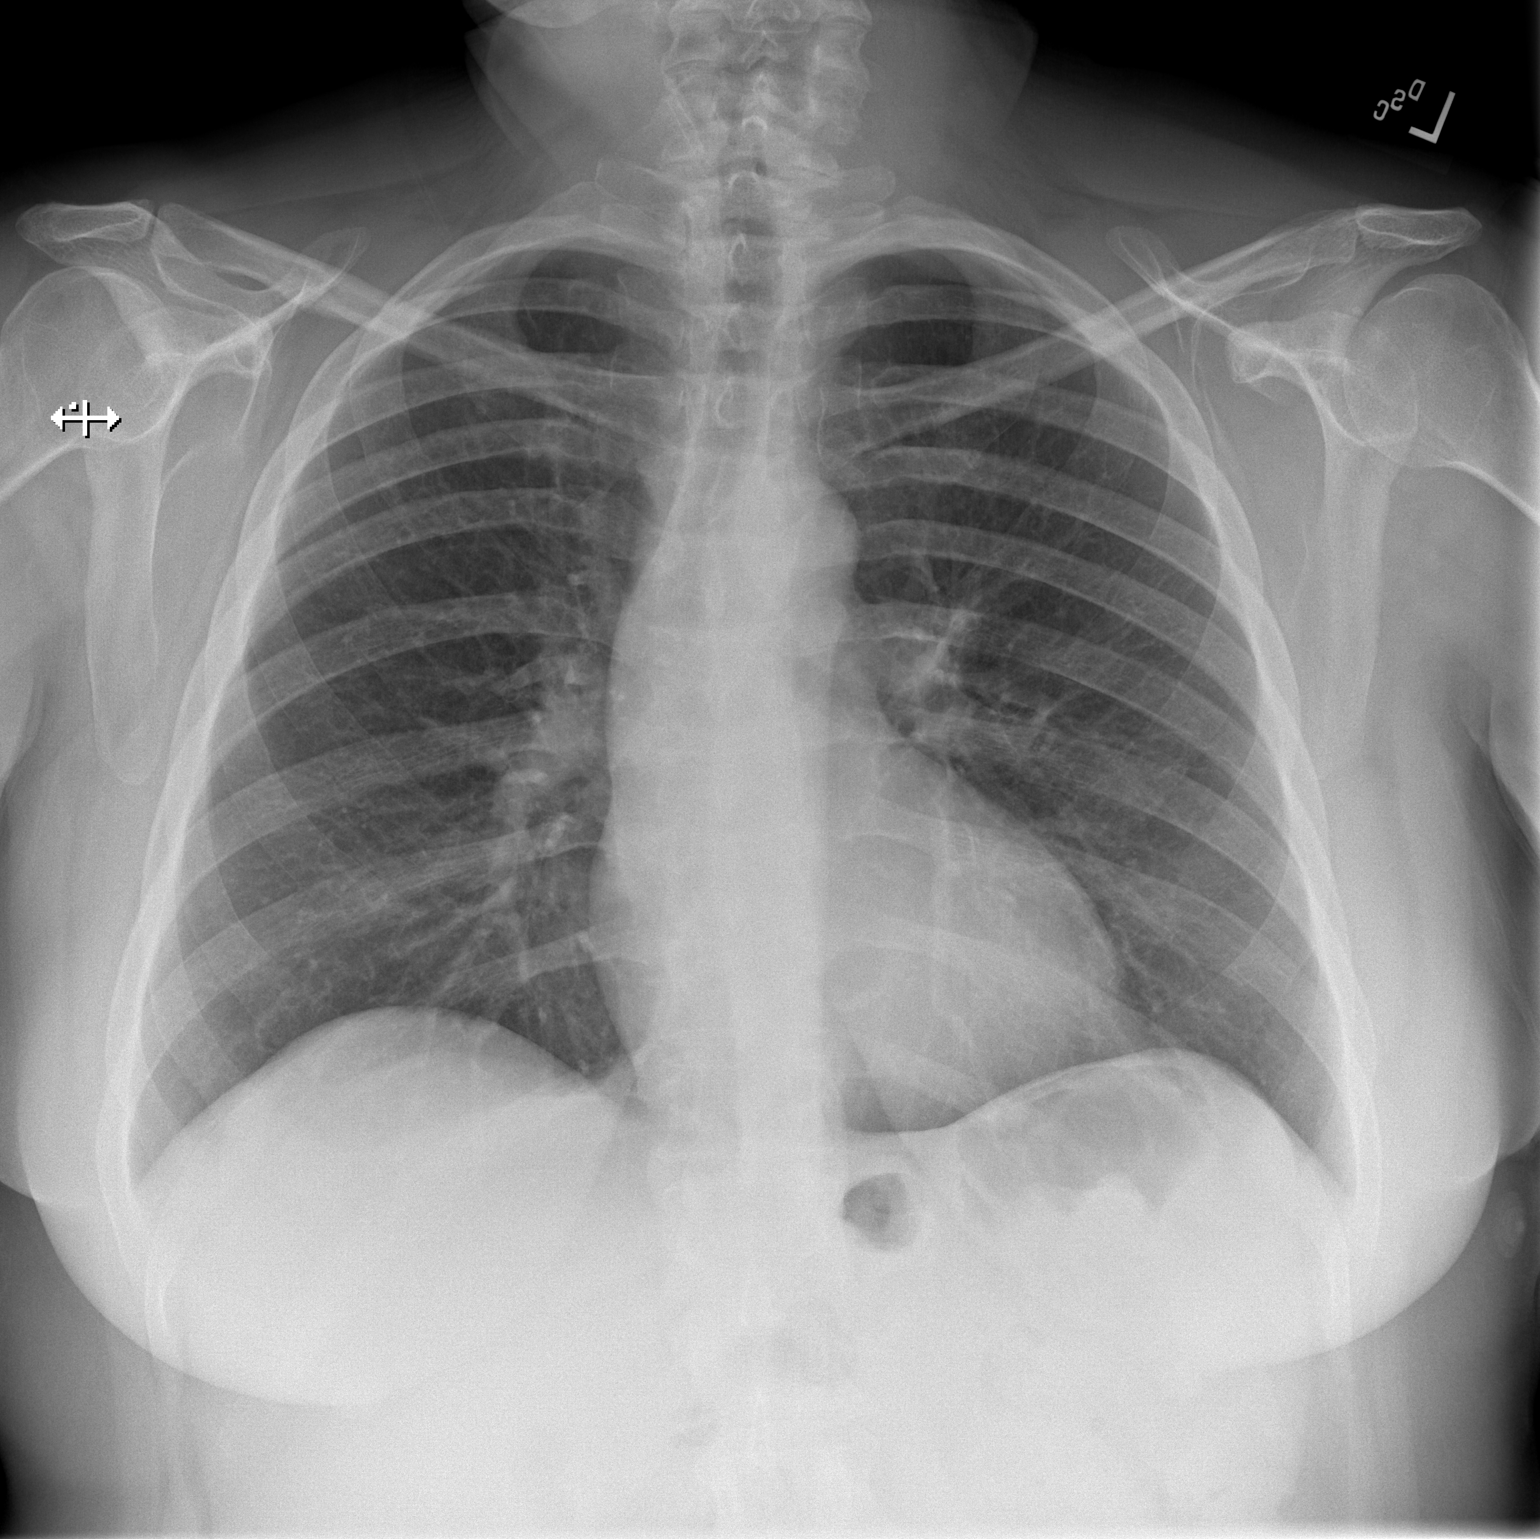

[w chest lat]
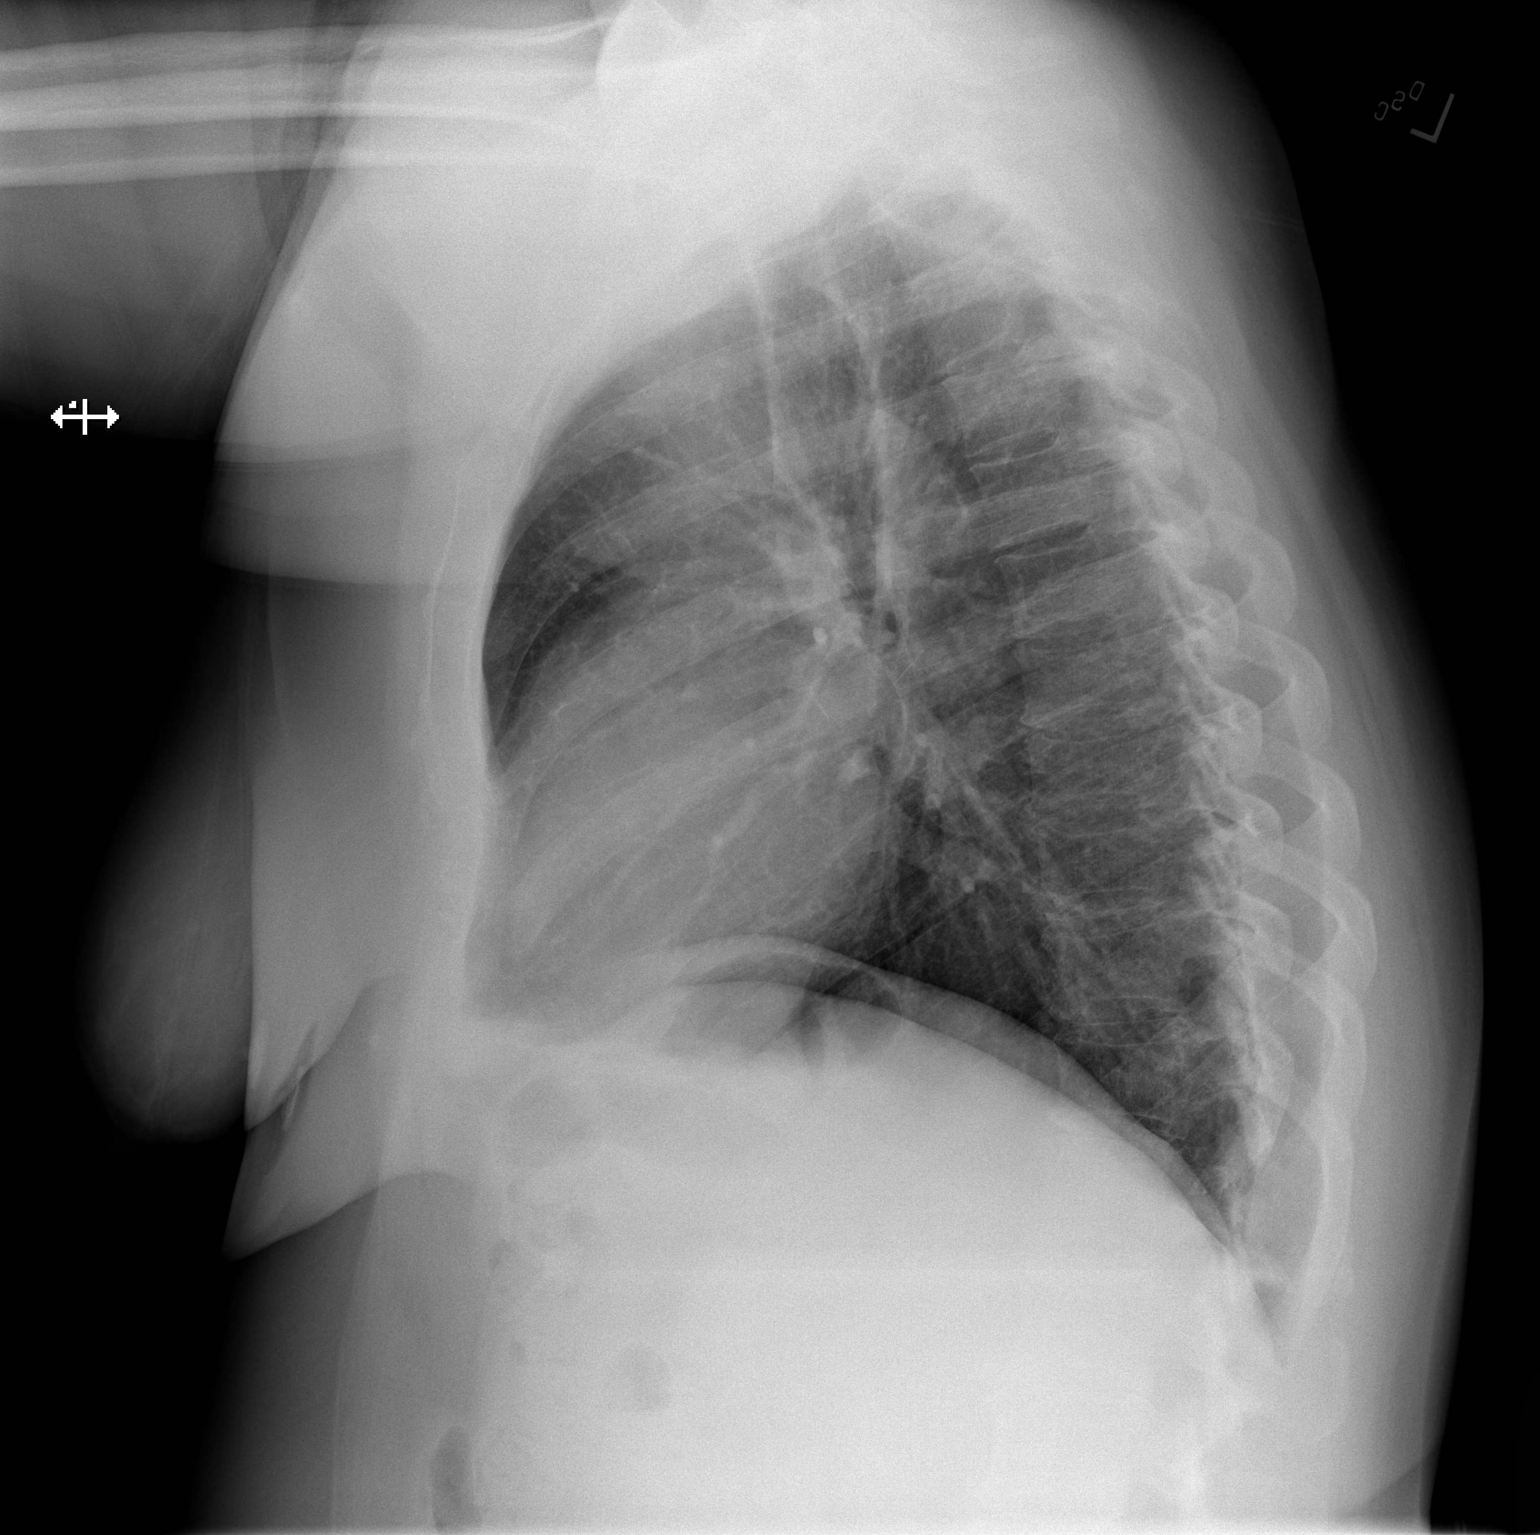

[2 of 2 positions shown; findings below may reference images not displayed]

FINDINGS: The heart size and mediastinal contours are within normal limits.
Both lungs are clear. The visualized skeletal structures are
unremarkable.
IMPRESSION: No active cardiopulmonary disease.

## 2022-10-23 ENCOUNTER — Encounter: Payer: Self-pay | Admitting: Family Medicine

## 2022-10-23 ENCOUNTER — Ambulatory Visit (INDEPENDENT_AMBULATORY_CARE_PROVIDER_SITE_OTHER): Payer: BC Managed Care – PPO | Admitting: Family Medicine

## 2022-10-23 VITALS — BP 136/84 | HR 77 | Temp 97.2°F | Ht 67.5 in | Wt 211.2 lb

## 2022-10-23 DIAGNOSIS — I10 Essential (primary) hypertension: Secondary | ICD-10-CM

## 2022-10-23 DIAGNOSIS — E782 Mixed hyperlipidemia: Secondary | ICD-10-CM

## 2022-10-23 DIAGNOSIS — Z1231 Encounter for screening mammogram for malignant neoplasm of breast: Secondary | ICD-10-CM

## 2022-10-23 DIAGNOSIS — N951 Menopausal and female climacteric states: Secondary | ICD-10-CM | POA: Diagnosis not present

## 2022-10-23 DIAGNOSIS — Z9884 Bariatric surgery status: Secondary | ICD-10-CM | POA: Diagnosis not present

## 2022-10-23 DIAGNOSIS — E669 Obesity, unspecified: Secondary | ICD-10-CM | POA: Diagnosis not present

## 2022-10-23 DIAGNOSIS — Z1211 Encounter for screening for malignant neoplasm of colon: Secondary | ICD-10-CM

## 2022-10-23 LAB — CBC
HCT: 39.4 % (ref 36.0–46.0)
Hemoglobin: 13.3 g/dL (ref 12.0–15.0)
MCHC: 33.7 g/dL (ref 30.0–36.0)
MCV: 79.9 fl (ref 78.0–100.0)
Platelets: 314 10*3/uL (ref 150.0–400.0)
RBC: 4.93 Mil/uL (ref 3.87–5.11)
RDW: 15.9 % — ABNORMAL HIGH (ref 11.5–15.5)
WBC: 6.2 10*3/uL (ref 4.0–10.5)

## 2022-10-23 LAB — COMPREHENSIVE METABOLIC PANEL
ALT: 15 U/L (ref 0–35)
AST: 16 U/L (ref 0–37)
Albumin: 4.2 g/dL (ref 3.5–5.2)
Alkaline Phosphatase: 48 U/L (ref 39–117)
BUN: 14 mg/dL (ref 6–23)
CO2: 32 mEq/L (ref 19–32)
Calcium: 8.9 mg/dL (ref 8.4–10.5)
Chloride: 103 mEq/L (ref 96–112)
Creatinine, Ser: 0.85 mg/dL (ref 0.40–1.20)
GFR: 78.49 mL/min (ref >60.00)
Glucose, Bld: 92 mg/dL (ref 70–99)
Potassium: 3.6 mEq/L (ref 3.5–5.1)
Sodium: 141 mEq/L (ref 135–145)
Total Bilirubin: 0.8 mg/dL (ref 0.2–1.2)
Total Protein: 6.7 g/dL (ref 6.0–8.3)

## 2022-10-23 LAB — LIPID PANEL
Cholesterol: 195 mg/dL (ref 0–200)
HDL: 67.5 mg/dL (ref >39.00)
LDL Cholesterol: 107 mg/dL — ABNORMAL HIGH (ref 0–99)
NonHDL: 127.13
Total CHOL/HDL Ratio: 3
Triglycerides: 99 mg/dL (ref 0.0–149.0)
VLDL: 19.8 mg/dL (ref 0.0–40.0)

## 2022-10-23 LAB — VITAMIN B12: Vitamin B-12: 174 pg/mL — ABNORMAL LOW (ref 211–911)

## 2022-10-23 LAB — VITAMIN D 25 HYDROXY (VIT D DEFICIENCY, FRACTURES): VITD: 22.1 ng/mL — ABNORMAL LOW (ref 30.00–100.00)

## 2022-10-23 LAB — FOLATE: Folate: 12.3 ng/mL (ref >5.9)

## 2022-10-23 LAB — HEMOGLOBIN A1C: Hgb A1c MFr Bld: 5.4 % (ref 4.6–6.5)

## 2022-10-23 NOTE — Progress Notes (Signed)
[facility] [address] [place] [address] Dept: [phone] Dept Fax: [phone]  New Patient Office Visit  Subjective:    Patient ID: Elizabeth Waller, female    DOB: 12/15/1970, 51 y.o..   MRN: [id number]  Chief Complaint  Patient presents with   Establish Care    NP-establish care. C/o having some weight gain and ? Menopause.  Fasting today.      History of Present Illness:  Patient is in today to establish care. [place] Her [place] moved to [facility] when she was 51 years old. She did attend some classes at [employer], but never completed a degree. 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 She has been married for 28 years. She has three children (27,23,20) and one grandchild. She denies use of tobacco, alcohol, or drugs.  Elizabeth Waller notes he greatest concern is regarding her weight. She states at her heaviest,s he weight ~ 263 lbs. She was able to drop her weight to ~ 235 lbs prior to undergoing a sleeve gastrectomy about 6 years ago. She notes that she was able to lose down into the 180s after surgery, but now has had a gradual increase in her weight. She feels that she did not get much support after her surgery. She does not engage in regular exercise. She notes her cooking can be a bit heavy at times. She had been on phentermine in the past. She does not current take any vitamin supplements.  Elizabeth Waller has a history of hypertension. She is currently on Maxzide, but notes this has been more to address fluid retention rather than treating her pressure.  Elizabeth Waller has a history of hyperlipidemia. She is not currently on treatment for this.  Elizabeth Waller feels she is going through menopause. She has not had a menses since Aug. She has had some episodic hot flashes.  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/30/2016   Obesity (BMI 30-39.9) 01/24/2016   Acute bilateral low back pain without sciatica 12/17/2015   Deviated nasal septum 12/17/2015   Hyperlipidemia 12/17/2015   Essential hypertension 12/17/2015   Hypertrophy of nasal turbinates 12/17/2015   Hypokalemia 12/17/2015   Mitral valve prolapse 12/17/2015   Acute recurrent maxillary sinusitis 11/01/2015   Chronic maxillary sinusitis 11/01/2015   Epistaxis 11/01/2015   Nasal polyps 11/01/2015   Past Surgical History:  Procedure Laterality Date   ABLATION     for scar tissue and endometriosis   CESAREAN SECTION     GASTRECTOMY     HAMMER TOE SURGERY Right    ORIF ANKLE FRACTURE Left    SINUS EXPLORATION     TUBAL LIGATION     Family History  Problem Relation Age of Onset   Cancer Mother        CLL   Hypertension Mother    Hypertension Father    CAD Father    Heart disease Maternal Grandfather     Outpatient Medications Prior to Visit  Medication Sig Dispense Refill   triamterene-hydrochlorothiazide (MAXZIDE-25) 37.5-25 MG tablet Take 1 tablet by mouth daily.     Cetirizine HCl (ZYRTEC ALLERGY) 10 MG CAPS  Take by oral route.     Norethindrone-Ethinyl Estradiol-Fe Biphas (LO LOESTRIN FE) 1 MG-10 MCG / 10 MCG tablet Take 1 tablet by mouth daily.     No facility-administered medications prior to visit.   No Known Allergies    Objective:   Today's Vitals   10/23/22 0814  BP: 136/84  Pulse: 77  Temp: (!) 97.2 F (36.2 C)  TempSrc: Temporal  SpO2: 97%  Weight: 211 lb 3.2 oz (95.8 kg)  Height: 5' 7.5" (1.715 m)   Body mass index is 32.59 kg/m.   General: Well developed, well nourished. No acute distress. Psych: Alert and oriented. Normal mood and affect.  Health  Maintenance Due  Topic Date Due   HIV Screening  Never done   Hepatitis C Screening  Never done   COLONOSCOPY (Pts 45-4yrs Insurance coverage will need to be confirmed)  Never done   PAP SMEAR-Modifier  03/25/2018   MAMMOGRAM  Never done   Zoster Vaccines- Shingrix (1 of 2) Never done     Assessment & Plan:   1. Essential hypertension Blood pressure is in a Stage 1 hypertension range. I will plan to reassess this at her next visit. If this remains elevated, I would consider a change/addition to her Maxzide 37.5-25 mg daily.  2. Obesity (BMI 30-39.9) Maximum weight: 263 lbs (~ 2017) Current weight: 211 lbs Total weight loss: 52 lbs (19.7%)  We did discuss the importance of following a healthy diet and regular exercise to  help achieve weight loss. I strongly advise her to start incorporating regular exercise, with a goal of 150 min. of moderate-intensity exercise weekly. I would like to have her engage with the healthy weight clinic, as I do feel she could use more support regarding dietary changes.  - Hemoglobin A1c - Amb Ref to Medical Weight Management  3. History of bariatric surgery I strongly recommend that Elizabeth Waller be takign a daily vitamin supplement. I will check on vitamin levles to make sure she is not exhibiting any significant deficiency related to her prior gastric surgery.  - CBC - Comprehensive metabolic panel - VITAMIN D 25 Hydroxy (Vit-D Deficiency, Fractures) - Hemoglobin A1c - Iron, TIBC and Ferritin Panel - Folate - Vitamin B12 - Vitamin B1 - Amb Ref to Medical Weight Management  4. Perimenopausal symptoms We discussed the normal progression of menopause. At the current time, her symptoms are not unbearable. I recommend she remain active and we will follow this.  5. Moderate mixed hyperlipidemia not requiring statin therapy I will reassess her lipids and determine management based on the results and cardiac risk score.  - Lipid panel  6. Screening  for colon cancer  - Ambulatory referral to Gastroenterology  7. Encounter for screening mammogram for malignant neoplasm of breast  - MM DIGITAL SCREENING BILATERAL; Future   Return in about 4 weeks (around 11/20/2022) for Pap smear and follow-up.   Loyola Mast, MD

## 2022-10-24 ENCOUNTER — Encounter: Payer: Self-pay | Admitting: Family Medicine

## 2022-10-24 DIAGNOSIS — E538 Deficiency of other specified B group vitamins: Secondary | ICD-10-CM | POA: Insufficient documentation

## 2022-10-24 DIAGNOSIS — E559 Vitamin D deficiency, unspecified: Secondary | ICD-10-CM | POA: Insufficient documentation

## 2022-10-24 DIAGNOSIS — E611 Iron deficiency: Secondary | ICD-10-CM | POA: Insufficient documentation

## 2022-10-27 ENCOUNTER — Other Ambulatory Visit: Payer: Self-pay

## 2022-10-27 MED ORDER — TRIAMTERENE-HCTZ 37.5-25 MG PO TABS: 1.0000 | ORAL_TABLET | Freq: Every day | ORAL | 1 refills | Status: DC

## 2022-10-27 NOTE — Telephone Encounter (Signed)
Patient notified VIA phone. Dm/cma  

## 2022-10-27 NOTE — Telephone Encounter (Signed)
Spoke to patient regarding labs and she noted that she is almost out of her BP meds and needs a refill.   Please review and advise. Thanks.  Dm/cma

## 2022-10-29 LAB — IRON,TIBC AND FERRITIN PANEL
%SAT: 13 % (calc) — ABNORMAL LOW (ref 16–45)
Ferritin: 5 ng/mL — ABNORMAL LOW (ref 16–232)
Iron: 50 ug/dL (ref 45–160)
TIBC: 372 mcg/dL (calc) (ref 250–450)

## 2022-10-29 LAB — VITAMIN B1: Vitamin B1 (Thiamine): 12 nmol/L (ref 8–30)

## 2022-11-18 ENCOUNTER — Telehealth (HOSPITAL_BASED_OUTPATIENT_CLINIC_OR_DEPARTMENT_OTHER): Payer: Self-pay

## 2022-11-18 ENCOUNTER — Telehealth: Payer: Self-pay

## 2022-11-18 ENCOUNTER — Encounter: Payer: Self-pay | Admitting: Family Medicine

## 2022-11-18 ENCOUNTER — Ambulatory Visit (INDEPENDENT_AMBULATORY_CARE_PROVIDER_SITE_OTHER): Payer: BC Managed Care – PPO | Admitting: Family Medicine

## 2022-11-18 VITALS — BP 124/80 | HR 74 | Temp 97.1°F | Ht 67.5 in | Wt 200.6 lb

## 2022-11-18 DIAGNOSIS — E611 Iron deficiency: Secondary | ICD-10-CM

## 2022-11-18 DIAGNOSIS — Z9884 Bariatric surgery status: Secondary | ICD-10-CM | POA: Diagnosis not present

## 2022-11-18 DIAGNOSIS — E559 Vitamin D deficiency, unspecified: Secondary | ICD-10-CM

## 2022-11-18 DIAGNOSIS — I10 Essential (primary) hypertension: Secondary | ICD-10-CM

## 2022-11-18 DIAGNOSIS — E669 Obesity, unspecified: Secondary | ICD-10-CM | POA: Diagnosis not present

## 2022-11-18 DIAGNOSIS — E538 Deficiency of other specified B group vitamins: Secondary | ICD-10-CM

## 2022-11-18 MED ORDER — WEGOVY 0.25 MG/0.5ML ~~LOC~~ SOAJ: 0.2500 mg | SUBCUTANEOUS | 0 refills | Status: DC

## 2022-11-18 NOTE — Progress Notes (Signed)
[facility] [address] [place] [address] Dept: [phone] Dept Fax: [phone]  Chronic Care Office Visit  Subjective:    Patient ID: Elizabeth Waller, female    DOB: September 10, 1971, 51 y.o..   MRN: [id number]  Chief Complaint  Patient presents with   Follow-up    4 week f/u.  No pap today.     History of Present Illness:  Patient is in today for reassessment of chronic medical issues.  Ms. Doner has a history of hypertension. She is currently on Maxzide 37.5-25 mg daily.  Ms. Hesketh has a history of morbid obesity. She underwent a prior  sleeve gastrectomy. At her last visit, we discussed her concerns about weight regain. I reviewed the importance of following a healthy diet and getting regular exercise. Over the past month, she has made some changes to her diet and has recommitted to her exercise routine. She is very pleased with the weight loss she saw with this. She does note some redundant skin on her upper arms and waist. She had been off of vitamin supplements, but has restarted these based on her last blood tests.  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   Obesity (BMI 30-39.9) 01/24/2016   Acute bilateral low back pain without sciatica 12/17/2015   Deviated nasal septum 12/17/2015   Borderline hyperlipidemia 12/17/2015   Essential hypertension 12/17/2015   Hypertrophy of nasal turbinates 12/17/2015   Mitral valve prolapse 12/17/2015   Acute recurrent maxillary sinusitis 11/01/2015   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/01/2015   Epistaxis 11/01/2015   Nasal polyps 11/01/2015   Past Surgical History:  Procedure Laterality Date   ABLATION     for scar tissue and endometriosis   CESAREAN SECTION     GASTRECTOMY     HAMMER TOE SURGERY Right    ORIF ANKLE FRACTURE Left    SINUS EXPLORATION     TUBAL LIGATION     Family History  Problem Relation Age of Onset   Cancer Mother        CLL   Hypertension Mother    Hypertension Father    CAD Father    Heart disease Maternal Grandfather    Outpatient Medications Prior to Visit  Medication Sig Dispense Refill   Cetirizine HCl (ZYRTEC ALLERGY) 10 MG CAPS Take by oral route.     cyanocobalamin (VITAMIN B12) 500 MCG tablet Take 500 mcg by mouth daily.     Multiple Vitamin (MULTIVITAMIN) tablet Take 1 tablet by mouth daily.     triamterene-hydrochlorothiazide (MAXZIDE-25) 37.5-25 MG tablet Take 1 tablet by mouth daily. 90 tablet 1   No facility-administered medications prior to visit.   No Known Allergies    Objective:   Today's Vitals   11/18/22 0848  BP: 124/80  Pulse: 74  Temp: (!) 97.1 F (36.2 C)  TempSrc: Temporal  SpO2: 96%  Weight: 200 lb 9.6 oz (91 kg)  Height: 5' 7.5" (1.715 m)   Body mass index is 30.95 kg/m.   General: Well developed, well nourished. No acute distress. Psych: Alert and oriented. Normal mood and affect.  Health Maintenance Due  Topic Date Due   HIV Screening  Never done   Hepatitis C Screening  Never done   COLONOSCOPY (Pts 45-69yrs Insurance coverage will need to be confirmed)  Never done   PAP SMEAR-Modifier  03/25/2018   MAMMOGRAM  Never done   Zoster Vaccines- Shingrix (1 of 2) Never done   Lab Results Last CBC Lab Results  Component Value Date   WBC 6.2 10/23/2022   HGB 13.3 10/23/2022   HCT 39.4 10/23/2022   MCV 79.9 10/23/2022   MCH 25.4 (L) 10/29/2021   RDW 15.9 (H) 10/23/2022   PLT 314.0 10/23/2022   Last metabolic panel Lab Results  Component Value Date    GLUCOSE 92 10/23/2022   NA 141 10/23/2022   K 3.6 10/23/2022   CL 103 10/23/2022   CO2 32 10/23/2022   BUN 14 10/23/2022   CREATININE 0.85 10/23/2022   GFRNONAA >60 10/29/2021   CALCIUM 8.9 10/23/2022   PROT 6.7 10/23/2022   ALBUMIN 4.2 10/23/2022   BILITOT 0.8 10/23/2022   ALKPHOS 48 10/23/2022   AST 16 10/23/2022   ALT 15 10/23/2022   ANIONGAP 6 10/29/2021   Last lipids Lab Results  Component Value Date   CHOL 195 10/23/2022   HDL 67.50 10/23/2022   LDLCALC 107 (H) 10/23/2022   TRIG 99.0 10/23/2022   CHOLHDL 3 10/23/2022   Last hemoglobin A1c Lab Results  Component Value Date   HGBA1C 5.4 10/23/2022   Last vitamin D Lab Results  Component Value Date   VD25OH 22.10 (L) 10/23/2022   Last vitamin B12 and Folate Lab Results  Component Value Date   VITAMINB12 174 (L) 10/23/2022   FOLATE 12.3 10/23/2022   Lab Results  Component Value Date   IRON 50 10/23/2022   TIBC 372 10/23/2022   FERRITIN 5 (L) 10/23/2022      Assessment & Plan:   1. Obesity (BMI 30-39.9) Maximum weight: 263 lbs (2017) Current weight: 200 lbs Weight change since last visit: - 11 lbs Total weight loss: 63 lbs (24%)  We reviewed current efforts. I strongly encourage Ms. Cadavid to keep up with her dietary changes and her regular exercise. We will try prescribing Wegovy and see if she can afford this. I will plan to follow up with her in a mont h to continue to monitor weight loss.  - Semaglutide-Weight Management (WEGOVY) 0.25 MG/0.5ML SOAJ; Inject 0.25 mg into the skin once a week.  Dispense: 2 mL; Refill: 0  2. History of bariatric surgery Strongly recommend lifelong vitamin supplementation. We did discuss potential plastic surgery to remove redundant skin. She is not ready for this move.  3. Essential hypertension Blood pressure is at goal. Continue Maxzide 37.5-25 mg daily.  4. Vitamin D insufficiency Mildly low. Patient has started a multivitamin, which should help to replace  this.  5. Vitamin B12 deficiency Mildly low. Patient has started a supplement and is feeling more energy.  6. Iron deficiency Mild. Has started a supplement. No anemia noted and no longer having menses. This should correct over time.   Return in about 4 weeks (around 12/16/2022) for Reassessment.   Loyola Mast, MD

## 2022-11-18 NOTE — Telephone Encounter (Signed)
Can you give me the information to where the medical management? Patient hasn't heard from them.   Thanks.  Dm/cma

## 2022-11-19 ENCOUNTER — Encounter: Payer: Self-pay | Admitting: Family Medicine

## 2022-11-19 ENCOUNTER — Ambulatory Visit: Payer: BC Managed Care – PPO | Admitting: Family Medicine

## 2022-11-19 DIAGNOSIS — E669 Obesity, unspecified: Secondary | ICD-10-CM

## 2022-11-19 MED ORDER — TIRZEPATIDE 2.5 MG/0.5ML ~~LOC~~ SOAJ: 2.5000 mg | SUBCUTANEOUS | 0 refills | Status: DC

## 2022-11-19 NOTE — Telephone Encounter (Signed)
Left detailed VM with name/address/phone number of weight/wellness clinic referred to. Dm/cma

## 2022-11-21 ENCOUNTER — Telehealth: Payer: Self-pay | Admitting: Family Medicine

## 2022-11-21 ENCOUNTER — Other Ambulatory Visit (HOSPITAL_COMMUNITY): Payer: Self-pay

## 2022-11-21 NOTE — Telephone Encounter (Signed)
Patient states: - She went to pharmacy and they informed her that the mounjaro needs a prior authorization   Patient requests: - This PA be completed as soon as possible

## 2022-11-26 ENCOUNTER — Telehealth: Payer: Self-pay

## 2022-11-26 ENCOUNTER — Other Ambulatory Visit (HOSPITAL_COMMUNITY): Payer: Self-pay

## 2022-11-26 DIAGNOSIS — E669 Obesity, unspecified: Secondary | ICD-10-CM

## 2022-11-26 NOTE — Telephone Encounter (Signed)
PA pending. Created new telephone encounter for PA 

## 2022-11-26 NOTE — Telephone Encounter (Signed)
Pharmacy Patient Advocate Encounter   Received notification from [facility] that prior authorization for [facility] 2.5MG /0.5ML pen-injectors is required/requested.   PA submitted on 11/26/22 to (ins) [employer] via CoverMyMeds Key [id number]  Status is pending

## 2022-11-27 NOTE — Telephone Encounter (Signed)
Left detailed VM regarding the denial.  Advised to call back if any questions. Dm/cma

## 2022-11-27 NOTE — Telephone Encounter (Signed)
Pharmacy Patient Advocate Encounter  Received notification from [facility] [employer] that the request for prior authorization for Mounjaro 2.5MG /0.5ML pen-injectors has been denied due to:.   Key: [id number]  How would you like to proceed?  Please be advised appeals may take up to 5 business days to be submitted as pharmacist prepares necessary documentation.  Thank you!

## 2022-11-28 MED ORDER — PHENTERMINE HCL 37.5 MG PO CAPS: 37.5000 mg | ORAL_CAPSULE | ORAL | 0 refills | Status: DC

## 2022-11-28 NOTE — Telephone Encounter (Signed)
Spoke to patient and wants to know since the Elizabeth Waller is denied, is there anything else that she can try?  She used to be on phentermine.  Is that an option? Please review and advise.  Thanks. Dm/cma

## 2022-11-28 NOTE — Addendum Note (Signed)
Addended by: Haydee Salter on: 11/28/2022 01:04 PM   Modules accepted: Orders

## 2022-12-01 NOTE — Telephone Encounter (Signed)
Lft VM to rtn call. Dm/cma  

## 2022-12-08 NOTE — Telephone Encounter (Signed)
Lft VM to rtn call. Dm/cma  

## 2022-12-11 NOTE — Telephone Encounter (Signed)
Patient scheduled for 12/16/22. ,Dm/cma

## 2022-12-16 ENCOUNTER — Ambulatory Visit: Payer: Self-pay | Admitting: Family Medicine

## 2023-01-07 DIAGNOSIS — Z1231 Encounter for screening mammogram for malignant neoplasm of breast: Secondary | ICD-10-CM | POA: Diagnosis not present

## 2023-01-07 DIAGNOSIS — N951 Menopausal and female climacteric states: Secondary | ICD-10-CM | POA: Diagnosis not present

## 2023-01-07 DIAGNOSIS — Z6831 Body mass index (BMI) 31.0-31.9, adult: Secondary | ICD-10-CM | POA: Diagnosis not present

## 2023-01-07 DIAGNOSIS — Z01419 Encounter for gynecological examination (general) (routine) without abnormal findings: Secondary | ICD-10-CM | POA: Diagnosis not present

## 2023-01-08 LAB — HM MAMMOGRAPHY

## 2023-01-09 ENCOUNTER — Encounter: Payer: Self-pay | Admitting: Family Medicine

## 2023-01-20 ENCOUNTER — Encounter: Payer: Self-pay | Admitting: Family Medicine

## 2023-01-22 ENCOUNTER — Ambulatory Visit (INDEPENDENT_AMBULATORY_CARE_PROVIDER_SITE_OTHER): Payer: BC Managed Care – PPO | Admitting: Family Medicine

## 2023-01-22 ENCOUNTER — Encounter: Payer: Self-pay | Admitting: Family Medicine

## 2023-01-22 VITALS — BP 132/70 | HR 76 | Temp 97.9°F | Ht 67.5 in | Wt 201.6 lb

## 2023-01-22 DIAGNOSIS — E669 Obesity, unspecified: Secondary | ICD-10-CM | POA: Diagnosis not present

## 2023-01-22 DIAGNOSIS — I10 Essential (primary) hypertension: Secondary | ICD-10-CM | POA: Diagnosis not present

## 2023-01-22 DIAGNOSIS — L821 Other seborrheic keratosis: Secondary | ICD-10-CM | POA: Diagnosis not present

## 2023-01-22 NOTE — Progress Notes (Signed)
[facility] [address] [place] [address] Dept: [phone] Dept Fax: [phone]  Chronic Care Office Visit  Subjective:    Patient ID: Elizabeth Waller, female    DOB: 1971-07-20, 52 y.o..   MRN: [id number]  Chief Complaint  Patient presents with   Medical Management of Chronic Issues    4 week f/u.     History of Present Illness:  Patient is in today for reassessment of chronic medical issues.  Elizabeth Waller has a history of morbid obesity. She underwent a prior  sleeve gastrectomy. Over the past 3 months, we discussed her concerns about weight regain. She has recommitted to a healthy diet and getting regular exercise. I had tried prescribing a GLP-1 RA, but her insurance would not approve this. She had been on phentermine in the past. I prescribed this in light of her coverage iussues. However, recenlty, she was seen by her gynecologist and noted to have a blood pressure of 168/100. He advised her to stop her phentermine and added an additional blood pressure medication.  Elizabeth Waller has a history of hypertension. She is currently on Maxzide 37.5-25 mg daily and now losartan 100 mg daily.  Elizabeth Waller notes she has a lesion at her hairline that used to be flat, but is now raised.  Elizabeth Waller notes Dr. Stann Mainland had done lab testing that confirms she is now in menopause.  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   Obesity (BMI 30-39.9) 01/24/2016   Acute bilateral low back pain without sciatica 12/17/2015   Deviated  nasal septum 12/17/2015   Borderline hyperlipidemia 12/17/2015   Essential hypertension 12/17/2015   Hypertrophy of nasal turbinates 12/17/2015   Mitral valve prolapse 12/17/2015   Acute recurrent maxillary sinusitis 11/01/2015   Chronic maxillary sinusitis 11/01/2015   Epistaxis 11/01/2015   Nasal polyps 11/01/2015   Past Surgical History:  Procedure Laterality Date   ABLATION     for scar tissue and endometriosis   CESAREAN SECTION     GASTRECTOMY     HAMMER TOE SURGERY Right    ORIF ANKLE FRACTURE Left    SINUS EXPLORATION     TUBAL LIGATION     Family History  Problem Relation Age of Onset   Cancer Mother        CLL   Hypertension Mother    Hypertension Father    CAD Father    Heart disease Maternal Grandfather    Outpatient Medications Prior to Visit  Medication Sig Dispense Refill   cyanocobalamin (VITAMIN B12) 500 MCG tablet Take 500 mcg by mouth daily.     losartan (COZAAR) 100 MG tablet Take 100 mg by mouth daily.     Multiple Vitamin (MULTIVITAMIN) tablet Take 1 tablet by mouth daily.     triamterene-hydrochlorothiazide (MAXZIDE-25) 37.5-25 MG tablet Take 1 tablet by mouth daily. 90 tablet 1   Cetirizine HCl (ZYRTEC ALLERGY) 10 MG CAPS Take by oral route. (Patient not taking: Reported on 01/22/2023)     phentermine 37.5 MG capsule Take 1 capsule (37.5 mg total) by mouth every morning. (Patient not taking:  Reported on 01/22/2023) 30 capsule 0   No facility-administered medications prior to visit.   No Known Allergies Objective:   Today's Vitals   01/22/23 0802  BP: 132/70  Pulse: 76  Temp: 97.9 F (36.6 C)  TempSrc: Temporal  SpO2: 98%  Weight: 201 lb 9.6 oz (91.4 kg)  Height: 5' 7.5" (1.715 m)   Body mass index is 31.11 kg/m.   General: Well developed, well nourished. No acute distress. Skin: Warm and dry. There is a 5-6 mm ovate, raised plaque with slight brown pigmentation and a rough   surface at the right forehead in the hairline. Psych: Alert  and oriented. Normal mood and affect.  Health Maintenance Due  Topic Date Due   HIV Screening  Never done   Hepatitis C Screening  Never done   COLONOSCOPY (Pts 45-91yr Insurance coverage will need to be confirmed)  Never done   Zoster Vaccines- Shingrix (1 of 2) Never done   COVID-19 Vaccine (3 - 2023-24 season) 07/25/2022   DTaP/Tdap/Td (2 - Td or Tdap) 12/03/2022     Assessment & Plan:   Problem List Items Addressed This Visit       Cardiovascular and Mediastinum   Essential hypertension - Primary    Blood pressure is now in good control. Continue Maxzide 37.5-25 mg daily and losartan 100 mg daily.       Relevant Medications   losartan (COZAAR) 100 MG tablet     Musculoskeletal and Integument   Seborrheic keratosis    Lesion is consistent with an SK. I reassured her this is a benign lesion. She plans to seek out a dermatologist to discuss cryotherapy.        Other   Obesity (BMI 30-39.9)    Maximum weight: 263 lbs (2017) Current weight: 201 lbs Weight change since last visit: + 1 lbs Total weight loss: 62 lbs (23.6%)  Ms. TDousedid not tolerate phentermine. She has been unable to afford GLP-1 Ras. I encouraged her to focus on her foundation of a healthy, calorie controlled diet and regular exercise. I recommend she check into whether her insurance would cover Contrave.       Return in about 3 months (around 04/22/2023) for Reassessment.   [name]

## 2023-01-22 NOTE — Assessment & Plan Note (Signed)
Maximum weight: 263 lbs (2017) Current weight: 201 lbs Weight change since last visit: + 1 lbs Total weight loss: 62 lbs (23.6%)  Elizabeth Waller did not tolerate phentermine. She has been unable to afford GLP-1 Ras. I encouraged her to focus on her foundation of a healthy, calorie controlled diet and regular exercise. I recommend she check into whether her insurance would cover Contrave.

## 2023-01-22 NOTE — Assessment & Plan Note (Signed)
Blood pressure is now in good control. Continue Maxzide 37.5-25 mg daily and losartan 100 mg daily.

## 2023-01-22 NOTE — Patient Instructions (Signed)
Check in to insurance coverage for Contrave (bupropion/naltrexone)

## 2023-01-22 NOTE — Assessment & Plan Note (Signed)
Lesion is consistent with an SK. I reassured her this is a benign lesion. She plans to seek out a dermatologist to discuss cryotherapy.

## 2023-03-12 DIAGNOSIS — R635 Abnormal weight gain: Secondary | ICD-10-CM | POA: Diagnosis not present

## 2023-03-12 DIAGNOSIS — Z6833 Body mass index (BMI) 33.0-33.9, adult: Secondary | ICD-10-CM | POA: Diagnosis not present

## 2023-03-20 ENCOUNTER — Encounter: Payer: Self-pay | Admitting: Family Medicine

## 2023-03-20 ENCOUNTER — Telehealth: Payer: Self-pay | Admitting: Family Medicine

## 2023-03-20 DIAGNOSIS — E669 Obesity, unspecified: Secondary | ICD-10-CM

## 2023-03-20 MED ORDER — WEGOVY 0.25 MG/0.5ML ~~LOC~~ SOAJ
0.2500 mg | SUBCUTANEOUS | 0 refills | Status: DC
Start: 2023-03-20 — End: 2023-03-23

## 2023-03-20 NOTE — Telephone Encounter (Signed)
Pt stated that her insurance will now cover some of Wegovy. She is requesting that you resubmit the script to [facility] with the PA Phone # [phone] Fax [phone] She is excited to start it.

## 2023-03-20 NOTE — Telephone Encounter (Signed)
Can you send in a prescription for the [facility] to [facility].  Then once sent we can have the Prior Authoration team do a PA.   Thanks. Dm/cma

## 2023-03-20 NOTE — Addendum Note (Signed)
Addended by: Loyola Mast on: 03/20/2023 04:33 PM   Modules accepted: Orders

## 2023-03-23 MED ORDER — WEGOVY 0.25 MG/0.5ML ~~LOC~~ SOAJ
0.2500 mg | SUBCUTANEOUS | 0 refills | Status: DC
Start: 2023-03-23 — End: 2023-12-09

## 2023-03-24 ENCOUNTER — Telehealth: Payer: Self-pay

## 2023-03-24 NOTE — Telephone Encounter (Signed)
Please give pt a cb she thinks concerning this matter. She says she got a call from our office.

## 2023-03-24 NOTE — Telephone Encounter (Signed)
Spoke to patient, she has a deductible to meet with RX's $600, she will have to pay $250 fior the wegovy.   PA was submitted and awaiting approval.  Dm/cma

## 2023-03-24 NOTE — Telephone Encounter (Signed)
Lft VM to rtn call. Dm/cma  

## 2023-03-24 NOTE — Telephone Encounter (Signed)
PA for [facility] submitted though cover my meds.  Awaiting response. Dm/cma   Key: [id number]

## 2023-03-25 ENCOUNTER — Telehealth: Payer: Self-pay | Admitting: Family Medicine

## 2023-03-25 NOTE — Telephone Encounter (Signed)
Pt has stated that the PA for her wegovy has been sent. Please send in script.

## 2023-03-25 NOTE — Telephone Encounter (Signed)
PA was denied by insurance.  Patient notified VIA phone. Dm/cma

## 2023-03-26 NOTE — Telephone Encounter (Signed)
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 Ok to leave detailed message.

## 2023-03-26 NOTE — Telephone Encounter (Signed)
Spoke tp her see other message. Dm/cma

## 2023-03-26 NOTE — Telephone Encounter (Signed)
PA submitted for [facility] through cover my meds.  Awaiting response.  Dm/cma   (Key: Elizabeth Waller

## 2023-03-26 NOTE — Telephone Encounter (Signed)
Last 3 OV notes and appeal paperwork faxed to [phone]. Dm/cma

## 2023-03-26 NOTE — Telephone Encounter (Signed)
PA was denied for [place].  Dm/cma

## 2023-03-26 NOTE — Telephone Encounter (Signed)
Lft VM to return call. Dm/cma ? ?

## 2023-04-02 ENCOUNTER — Other Ambulatory Visit (HOSPITAL_COMMUNITY): Payer: Self-pay

## 2023-04-02 NOTE — Telephone Encounter (Signed)
Patient Advocate Encounter  Prior Authorization for [employer] 0.25MG /0.5ML auto-injectors has been approved through [employer].    Elizabeth Waller  Effective: 03-26-2023 to 10-26-2023

## 2023-04-06 NOTE — Telephone Encounter (Signed)
Left detailed VM that Elizabeth Waller is approved and pharmacy notified and has it in stock.  Dm/cma

## 2023-04-24 ENCOUNTER — Ambulatory Visit: Payer: BC Managed Care – PPO | Admitting: Family Medicine

## 2023-07-06 ENCOUNTER — Other Ambulatory Visit: Payer: Self-pay | Admitting: Family Medicine

## 2023-07-16 DIAGNOSIS — R102 Pelvic and perineal pain: Secondary | ICD-10-CM | POA: Diagnosis not present

## 2023-07-16 DIAGNOSIS — Z113 Encounter for screening for infections with a predominantly sexual mode of transmission: Secondary | ICD-10-CM | POA: Diagnosis not present

## 2023-07-16 DIAGNOSIS — N950 Postmenopausal bleeding: Secondary | ICD-10-CM | POA: Diagnosis not present

## 2023-12-09 ENCOUNTER — Other Ambulatory Visit: Payer: Self-pay

## 2023-12-09 ENCOUNTER — Telehealth: Payer: Self-pay

## 2023-12-09 ENCOUNTER — Ambulatory Visit: Payer: BC Managed Care – PPO | Admitting: Nurse Practitioner

## 2023-12-09 ENCOUNTER — Encounter: Payer: Self-pay | Admitting: Nurse Practitioner

## 2023-12-09 VITALS — BP 130/82 | HR 75 | Temp 98.0°F | Resp 18 | Wt 218.4 lb

## 2023-12-09 DIAGNOSIS — A084 Viral intestinal infection, unspecified: Secondary | ICD-10-CM | POA: Diagnosis not present

## 2023-12-09 DIAGNOSIS — B372 Candidiasis of skin and nail: Secondary | ICD-10-CM

## 2023-12-09 MED ORDER — BISMUTH SUBSALICYLATE 262 MG PO CHEW
524.0000 mg | CHEWABLE_TABLET | Freq: Three times a day (TID) | ORAL | Status: AC
Start: 2023-12-09 — End: ?

## 2023-12-09 MED ORDER — KETOCONAZOLE 2 % EX CREA: 1.0000 | TOPICAL_CREAM | Freq: Every day | CUTANEOUS | 0 refills | Status: DC

## 2023-12-09 MED ORDER — FLUCONAZOLE 150 MG PO TABS: 150.0000 mg | ORAL_TABLET | ORAL | 0 refills | Status: AC

## 2023-12-09 MED ORDER — KETOCONAZOLE 2 % EX CREA: 1.0000 | TOPICAL_CREAM | Freq: Every day | CUTANEOUS | 0 refills | Status: AC

## 2023-12-09 MED ORDER — FLUCONAZOLE 150 MG PO TABS: 150.0000 mg | ORAL_TABLET | ORAL | 0 refills | Status: DC

## 2023-12-09 NOTE — Telephone Encounter (Signed)
 LVM for pt to cb. Pending the symptoms listed in the appointment note for this morning at 10:20 with Kathrene Parents, pt may need to go to ED. Will ask pt more probing questions to follow up and advise.

## 2023-12-09 NOTE — Progress Notes (Signed)
 Acute Office Visit  Subjective:    Patient ID: Elizabeth Waller, female    DOB: 09/20/1971, 53 y.o.   MRN: [id number]  Chief Complaint  Patient presents with   Acute Visit    PT C/O of loose stool with fishy smell x 3days. PT also have concerns with yeast infection groin area.   Rash This is a new problem. The current episode started 1 to 4 weeks ago. The problem has been gradually worsening since onset. The affected locations include the abdomen. The rash is characterized by itchiness and redness. She was exposed to nothing. Associated symptoms include diarrhea and vomiting. Pertinent negatives include no cough or fever. Treatments tried: cornstarch. The treatment provided no relief. There is no history of allergies, asthma, eczema or varicella.  Diarrhea  This is a new problem. The current episode started in the past 7 days. The problem occurs 2 to 4 times per day. The problem has been unchanged. Diarrhea characteristics: loose and soft. The patient states that diarrhea does not awaken her from sleep. Associated symptoms include abdominal pain, bloating, chills, headaches, myalgias and vomiting. Pertinent negatives include no arthralgias, coughing, fever, increased  flatus, sweats, URI or weight loss. Associated symptoms comments: No melena or hematochezia. Nothing aggravates the symptoms. There are no known risk factors. She has tried nothing for the symptoms. There is no history of bowel resection, inflammatory bowel disease, irritable bowel syndrome, malabsorption, a recent abdominal surgery or short gut syndrome.  5episodes yesterday, associated headache and ABDOMEN cramp. 1episode of vomiting yesterday. No diarrhea or emesis today. S/p abd gastric sleeve 2017.  2weeks ago had formed stool with streak of blood, resolved spontaneus resolution  Outpatient Medications Prior to Visit  Medication Sig   cyanocobalamin (VITAMIN B12) 500 MCG tablet Take 500 mcg by mouth daily.   fluconazole   (DIFLUCAN ) 150 MG tablet Take 1 tablet (150 mg total) by mouth once a week.   ketoconazole  (NIZORAL ) 2 % cream Apply 1 Application topically daily.   losartan (COZAAR) 100 MG tablet Take 100 mg by mouth daily.   Multiple Vitamin (MULTIVITAMIN) tablet Take 1 tablet by mouth daily.   progesterone (PROMETRIUM) 200 MG capsule TAKE 1 CAPSULE BY MOUTH EVERYDAY AT BEDTIME   triamterene -hydrochlorothiazide (MAXZIDE-25) 37.5-25 MG tablet TAKE 1 TABLET BY MOUTH ONCE DAILY   valACYclovir (VALTREX) 1000 MG tablet ONE TABLET TWICE DAILY FOR 2 DAYS FOR "OUTBREAK" // OTHERWISE ONE TABLET DAILY   [DISCONTINUED] Semaglutide -Weight Management (WEGOVY ) 0.25 MG/0.5ML SOAJ Inject 0.25 mg into the skin once a week.   No facility-administered medications prior to visit.   Reviewed past medical and social history.  Review of Systems  Constitutional:  Positive for chills. Negative for fever and weight loss.  Respiratory:  Negative for cough.   Gastrointestinal:  Positive for abdominal pain, bloating, diarrhea and vomiting. Negative for flatus.  Musculoskeletal:  Positive for myalgias. Negative for arthralgias.  Skin:  Positive for rash.  Neurological:  Positive for headaches.   Per HPI     Objective:    Physical Exam Vitals and nursing note reviewed.  Constitutional:      General: She is not in acute distress.    Appearance: She is not ill-appearing.  Cardiovascular:     Rate and Rhythm: Normal rate.     Pulses: Normal pulses.  Pulmonary:     Effort: Pulmonary effort is normal.  Abdominal:     General: There is no distension.     Palpations: Abdomen is soft.  Tenderness: There is generalized abdominal tenderness. There is no right CVA tenderness, left CVA tenderness, guarding or rebound.       Comments: Red macular rash on lower abdominal fold  Skin:    Findings: Erythema and rash present.  Neurological:     Mental Status: She is alert.    BP 130/82 (BP Location: Left Arm, Patient Position:  Sitting, Cuff Size: Large) Comment: recheck  Pulse 75   Temp 98 F (36.7 C) (Temporal)   Resp 18   Wt 218 lb 6.4 oz (99.1 kg)   LMP 07/08/2002 (Exact Date)   SpO2 97%   BMI 33.70 kg/m    No results found for any visits on 12/09/23.     Assessment & Plan:   Problem List Items Addressed This Visit   None Visit Diagnoses       Viral gastroenteritis    -  Primary   Relevant Medications   valACYclovir (VALTREX) 1000 MG tablet   bismuth  subsalicylate (PEPTO-BISMOL) 262 MG chewable tablet     Candidiasis of skin       Relevant Medications   valACYclovir (VALTREX) 1000 MG tablet      Meds ordered this encounter  Medications   bismuth  subsalicylate (PEPTO-BISMOL) 262 MG chewable tablet    Sig: Chew 2 tablets (524 mg total) by mouth 4 (four) times daily -  before meals and at bedtime.    Supervising Provider:   Christianna Cowman ALFRED [[id number]]   DISCONTD: ketoconazole  (NIZORAL ) 2 % cream    Sig: Apply 1 Application topically daily.    Dispense:  15 g    Refill:  0    Supervising Provider:   Christianna Cowman ALFRED [[id number]]   DISCONTD: fluconazole  (DIFLUCAN ) 150 MG tablet    Sig: Take 1 tablet (150 mg total) by mouth once a week.    Dispense:  4 tablet    Refill:  0    Supervising Provider:   Christianna Cowman ALFRED [[id number]]  Maintain adequate oral hydration Ok to use pepto bismol for nausea, ABDOMEN cramp and diarrhea. Maintain bland diet x 2days Call office if symptoms do not improve in 1week. Advised to schedule appointment with GI for colonoscopy, once GI symptoms resolve.  Return if symptoms worsen or fail to improve.   Kathrene Parents, NP

## 2023-12-09 NOTE — Telephone Encounter (Signed)
 2nd attempt to contact pt regarding appt scheduled at 1020 this morning with [facility].

## 2023-12-09 NOTE — Patient Instructions (Addendum)
 Maintain adequate oral hydration Ok to use pepto bismol for nausea, ABDOMEN cramp and diarrhea. Maintain bland diet x 2days Call office if symptoms do not improve in 1week.  Schedule appointment with healthy weight [phone] Schedule appointment with [phone]  Viral Gastroenteritis, Adult  Viral gastroenteritis is also known as the stomach flu. This condition may affect your stomach, small intestine, and large intestine. It can cause sudden watery diarrhea, fever, and vomiting. This condition is caused by many different viruses. These viruses can be passed from person to person very easily (are contagious). Diarrhea and vomiting can make you feel weak and cause you to become dehydrated. You may not be able to keep fluids down. Dehydration can make you tired and thirsty, cause you to have a dry mouth, and decrease how often you urinate. It is important to replace the fluids that you lose from diarrhea and vomiting. What are the causes? Gastroenteritis is caused by many viruses, including rotavirus and norovirus. Norovirus is the most common cause in adults. You can get sick after being exposed to the viruses from other people. You can also get sick by: Eating food, drinking water, or touching a surface contaminated with one of these viruses. Sharing utensils or other personal items with an infected person. What increases the risk? You are more likely to develop this condition if you: Have a weak body defense system (immune system). Live with one or more children who are younger than 2 years. Live in a nursing home. Travel on cruise ships. What are the signs or symptoms? Symptoms of this condition start suddenly 1-3 days after exposure to a virus. Symptoms may last for a few days or for as long as a week. Common symptoms include watery diarrhea and vomiting. Other symptoms include: Fever. Headache. Fatigue. Pain in the abdomen. Chills. Weakness. Nausea. Muscle aches. Loss  of appetite. How is this diagnosed? This condition is diagnosed with a medical history and physical exam. You may also have a stool test to check for viruses or other infections. How is this treated? This condition typically goes away on its own. The focus of treatment is to prevent dehydration and restore lost fluids (rehydration). This condition may be treated with: An oral rehydration solution (ORS) to replace important salts and minerals (electrolytes) in your body. Take this if told by your health care provider. This is a drink that is sold at pharmacies and retail stores. Medicines to help with your symptoms. Probiotic supplements to reduce symptoms of diarrhea. Fluids given through an IV, if dehydration is severe. Older adults and people with other diseases or a weak immune system are at higher risk for dehydration. Follow these instructions at home: Eating and drinking  Take an ORS as told by your health care provider. Drink clear fluids in small amounts as you are able. Clear fluids include: Water. Ice chips. Diluted fruit juice. Low-calorie sports drinks. Drink enough fluid to keep your urine pale yellow. Eat small amounts of healthy foods every 3-4 hours as you are able. This may include whole grains, fruits, vegetables, lean meats, and yogurt. Avoid fluids that contain a lot of sugar or caffeine, such as energy drinks, sports drinks, and soda. Avoid spicy or fatty foods. Avoid alcohol. General instructions  Wash your hands often, especially after having diarrhea or vomiting. If soap and water are not available, use hand sanitizer. Make sure that all people in your household wash their hands well and often. Take over-the-counter and prescription medicines only as  told by your health care provider. Rest at home while you recover. Watch your condition for any changes. Take a warm bath to relieve any burning or pain from frequent diarrhea episodes. Keep all follow-up visits.  This is important. Contact a health care provider if you: Cannot keep fluids down. Have symptoms that get worse. Have new symptoms. Feel light-headed or dizzy. Have muscle cramps. Get help right away if you: Have chest pain. Have trouble breathing or you are breathing very quickly. Have a fast heartbeat. Feel extremely weak or you faint. Have a severe headache, a stiff neck, or both. Have a rash. Have severe pain, cramping, or bloating in your abdomen. Have skin that feels cold and clammy. Feel confused. Have pain when you urinate. Have signs of dehydration, such as: Dark urine, very little urine, or no urine. Cracked lips. Dry mouth. Sunken eyes. Sleepiness. Weakness. Have signs of bleeding, such as: Seeing blood in your vomit. Having vomit that looks like coffee grounds. Having bloody or black stools or stools that look like tar. These symptoms may be an emergency. Get help right away. Call 911. Do not wait to see if the symptoms will go away. Do not drive yourself to the hospital. Summary Viral gastroenteritis is also known as the stomach flu. It can cause sudden watery diarrhea, fever, and vomiting. This condition can be passed from person to person very easily (is contagious). Take an oral rehydration solution (ORS) if told by your health care provider. This is a drink that is sold at pharmacies and retail stores. Wash your hands often, especially after having diarrhea or vomiting. If soap and water are not available, use hand sanitizer. This information is not intended to replace advice given to you by your health care provider. Make sure you discuss any questions you have with your health care provider. Document Revised: 09/09/2021 Document Reviewed: 09/09/2021 Elsevier Patient Education  2024 [employer].

## 2023-12-14 ENCOUNTER — Ambulatory Visit: Payer: Self-pay | Admitting: Family Medicine

## 2023-12-14 NOTE — Telephone Encounter (Signed)
Patient scheduled with Dr Janee Morn on 12/15/23. Dm/cma

## 2023-12-14 NOTE — Telephone Encounter (Signed)
Copied from CRM [id number]. Topic: Clinical - Red Word Triage >> Dec 14, 2023 11:06 AM Steele Sizer wrote: Red Word that prompted transfer to Nurse Triage: Pt stated that she has blood in her stool, headaches, and not eating much. Pt stated that she noticed the blood about two weeks ago.  Chief Complaint: rectal bleeding Symptoms: rectal bleeding, abd & back pain 5/10 Frequency: bleeding started yesterday abd& back pain x 2eeks Pertinent Negatives: Patient denies SOB Disposition: [] ED /[] Urgent Care (no appt availability in office) / [] Appointment(In office/virtual)/ []  [facility]/ [] Home Care/ [] Refused Recommended Disposition /[] [facility]/ []  Follow-up with PCP Additional Notes: pt stated last office visit made PCP aware of rectal bleeding in past and was informed to monitor and if it started back notify office / to evaluate.  Reason for Disposition  MILD rectal bleeding (more than just a few drops or streaks)  Answer Assessment - Initial Assessment Questions 1. APPEARANCE of BLOOD: "What color is it?" "Is it passed separately, on the surface of the stool, or mixed in with the stool?"      Medium red with pus/mucous in bowel movement 2. AMOUNT: "How much blood was passed?"      Small amount of blood, very distant odor "like GI bleed odor" 3. FREQUENCY: "How many times has blood been passed with the stools?"      Comes and goes 4. ONSET: "When was the blood first seen in the stools?" (Days or weeks)      Today   5. DIARRHEA: "Is there also some diarrhea?" If Yes, ask: "How many diarrhea stools in the past 24 hours?"      no 6. CONSTIPATION: "Do you have constipation?" If Yes, ask: "How bad is it?"     no 7. RECURRENT SYMPTOMS: "Have you had blood in your stools before?" If Yes, ask: "When was the last time?" and "What happened that time?"      Had some blood in stool in past 8. BLOOD THINNERS: "Do you take any blood thinners?" (e.g., Coumadin/warfarin,  Pradaxa/dabigatran, aspirin)     no 9. OTHER SYMPTOMS: "Do you have any other symptoms?"  (e.g., abdomen pain, vomiting, dizziness, fever)     Abd and low back pain 5/10 10. PREGNANCY: "Is there any chance you are pregnant?" "When was your last menstrual period?"       no  Protocols used: Rectal Bleeding-A-AH

## 2023-12-15 ENCOUNTER — Ambulatory Visit (INDEPENDENT_AMBULATORY_CARE_PROVIDER_SITE_OTHER): Payer: BC Managed Care – PPO | Admitting: Family Medicine

## 2023-12-15 ENCOUNTER — Encounter: Payer: Self-pay | Admitting: Family Medicine

## 2023-12-15 ENCOUNTER — Ambulatory Visit (HOSPITAL_BASED_OUTPATIENT_CLINIC_OR_DEPARTMENT_OTHER)
Admission: RE | Admit: 2023-12-15 | Discharge: 2023-12-15 | Disposition: A | Discharge status: Home / Self Care | Payer: BC Managed Care – PPO | Source: Ambulatory Visit | Attending: Family Medicine | Admitting: Family Medicine

## 2023-12-15 VITALS — BP 110/76 | HR 83 | Temp 97.7°F | Ht 67.5 in | Wt 213.4 lb

## 2023-12-15 DIAGNOSIS — A084 Viral intestinal infection, unspecified: Secondary | ICD-10-CM

## 2023-12-15 DIAGNOSIS — R1084 Generalized abdominal pain: Secondary | ICD-10-CM

## 2023-12-15 DIAGNOSIS — A09 Infectious gastroenteritis and colitis, unspecified: Secondary | ICD-10-CM

## 2023-12-15 DIAGNOSIS — Z8719 Personal history of other diseases of the digestive system: Secondary | ICD-10-CM

## 2023-12-15 DIAGNOSIS — R634 Abnormal weight loss: Secondary | ICD-10-CM

## 2023-12-15 DIAGNOSIS — K8020 Calculus of gallbladder without cholecystitis without obstruction: Secondary | ICD-10-CM | POA: Diagnosis not present

## 2023-12-15 DIAGNOSIS — R935 Abnormal findings on diagnostic imaging of other abdominal regions, including retroperitoneum: Secondary | ICD-10-CM | POA: Diagnosis not present

## 2023-12-15 DIAGNOSIS — K5730 Diverticulosis of large intestine without perforation or abscess without bleeding: Secondary | ICD-10-CM | POA: Diagnosis not present

## 2023-12-15 DIAGNOSIS — K921 Melena: Secondary | ICD-10-CM

## 2023-12-15 DIAGNOSIS — K5792 Diverticulitis of intestine, part unspecified, without perforation or abscess without bleeding: Secondary | ICD-10-CM | POA: Diagnosis not present

## 2023-12-15 DIAGNOSIS — K6389 Other specified diseases of intestine: Secondary | ICD-10-CM

## 2023-12-15 LAB — COMPREHENSIVE METABOLIC PANEL
ALT: 16 U/L (ref 0–35)
AST: 19 U/L (ref 0–37)
Albumin: 4.5 g/dL (ref 3.5–5.2)
Alkaline Phosphatase: 60 U/L (ref 39–117)
BUN: 13 mg/dL (ref 6–23)
CO2: 32 meq/L (ref 19–32)
Calcium: 9.4 mg/dL (ref 8.4–10.5)
Chloride: 98 meq/L (ref 96–112)
Creatinine, Ser: 0.87 mg/dL (ref 0.40–1.20)
GFR: 75.72 mL/min (ref >60.00)
Glucose, Bld: 97 mg/dL (ref 70–99)
Potassium: 3.6 meq/L (ref 3.5–5.1)
Sodium: 138 meq/L (ref 135–145)
Total Bilirubin: 0.5 mg/dL (ref 0.2–1.2)
Total Protein: 7.7 g/dL (ref 6.0–8.3)

## 2023-12-15 LAB — CBC WITH DIFFERENTIAL/PLATELET
Basophils Absolute: 0 10*3/uL (ref 0.0–0.1)
Basophils Relative: 0.5 % (ref 0.0–3.0)
Eosinophils Absolute: 0.4 10*3/uL (ref 0.0–0.7)
Eosinophils Relative: 8 % — ABNORMAL HIGH (ref 0.0–5.0)
HCT: 43 % (ref 36.0–46.0)
Hemoglobin: 14.1 g/dL (ref 12.0–15.0)
Lymphocytes Relative: 25.5 % (ref 12.0–46.0)
Lymphs Abs: 1.4 10*3/uL (ref 0.7–4.0)
MCHC: 32.9 g/dL (ref 30.0–36.0)
MCV: 85 fL (ref 78.0–100.0)
Monocytes Absolute: 0.4 10*3/uL (ref 0.1–1.0)
Monocytes Relative: 8.1 % (ref 3.0–12.0)
Neutro Abs: 3.1 10*3/uL (ref 1.4–7.7)
Neutrophils Relative %: 57.9 % (ref 43.0–77.0)
Platelets: 259 10*3/uL (ref 150.0–400.0)
RBC: 5.05 Mil/uL (ref 3.87–5.11)
RDW: 14.6 % (ref 11.5–15.5)
WBC: 5.3 10*3/uL (ref 4.0–10.5)

## 2023-12-15 LAB — LIPASE: Lipase: 20 U/L (ref 11.0–59.0)

## 2023-12-15 MED ORDER — AMOXICILLIN-POT CLAVULANATE 875-125 MG PO TABS: 1.0000 | ORAL_TABLET | Freq: Two times a day (BID) | ORAL | 0 refills | Status: DC

## 2023-12-15 NOTE — Assessment & Plan Note (Signed)
Patient presents with ongoing diarrhea and abdominal cramping over the past few weeks, accompanied by intermittent rectal bleeding, weight loss, lack of appetite, and extreme fatigue. Symptoms are reminiscent of a past episode of diverticulitis. History includes multiple abdominal surgeries, raising the possibility of adhesions contributing to symptoms.  Differential Diagnosis:  Diverticulitis.  Most likely given past history. Gastroenteritis IBD Colorectal Neoplasm  Hemorrhoids. Abdominal Adhesions. Given history of multiple abdominal surgeries, potential cause of abdominal discomfort.  Plan: Order CBC to assess for anemia and infection. Check electrolytes, liver function tests, and lipase levels. Conduct urinalysis. GI PCR evaluate for infectious etiologies  Fecal occult blood, however possibly false positive in the setting bismuth, given observed hematochezia, will monitor  Fecal inflammatory markers. CT scan of the abdomen/pelvis to evaluate for diverticulitis, adhesions, or other pathology. Prescribe Augmentin 875 mg/125 mg tablets, one tablet twice daily for 7 days, for potential diverticulitis Instruct patient to hold off on starting antibiotics until further notice. Refer to gastroenterology for colonoscopy and comprehensive evaluation. Advise on the importance of hydration and proper nutrition. Seek immediate medical attention if experiencing severe nausea, vomiting, or increased rectal bleeding.

## 2023-12-15 NOTE — Patient Instructions (Addendum)
-   We are ordering a CT scan of your abdomen. Office will call to help schedule - Provide a stool sample for additional testing  - Follow up with the gastroenterologist as referred for further evaluation. - Seek immediate medical attention if you experience severe nausea, vomiting, or increased rectal bleeding. - If diverticulitis is seen on the CT scan, you can start Augmentin as prescribed.

## 2023-12-15 NOTE — Addendum Note (Signed)
Addended by: Tonita Phoenix on: 12/15/2023 09:15 AM   Modules accepted: Orders

## 2023-12-15 NOTE — Progress Notes (Signed)
Assessment/Plan:   Problem List Items Addressed This Visit       Other   Hematochezia   Patient presents with ongoing diarrhea and abdominal cramping over the past few weeks, accompanied by intermittent rectal bleeding, weight loss, lack of appetite, and extreme fatigue. Symptoms are reminiscent of a past episode of diverticulitis. History includes multiple abdominal surgeries, raising the possibility of adhesions contributing to symptoms.  Differential Diagnosis:  Diverticulitis.  Most likely given past history. Gastroenteritis IBD Colorectal Neoplasm  Hemorrhoids. Abdominal Adhesions. Given history of multiple abdominal surgeries, potential cause of abdominal discomfort.  Plan: Order CBC to assess for anemia and infection. Check electrolytes, liver function tests, and lipase levels. Conduct urinalysis. GI PCR evaluate for infectious etiologies  Fecal occult blood, however possibly false positive in the setting bism[place], given observed hematochezia, will monitor  Fecal inflammatory markers. CT scan of the abdomen/pelvis to evaluate for diverticulitis, adhesions, or other pathology. Prescribe Augmentin 875 mg/125 mg tablets, one tablet twice daily for 7 days, for potential diverticulitis Instruct patient to hold off on starting antibiotics until further notice. Refer to gastroenterology for colonoscopy and comprehensive evaluation. Advise on the importance of hydration and proper nutrition. Seek immediate medical attention if experiencing severe nausea, vomiting, or increased rectal bleeding.      Relevant Medications   amoxicillin-clavulanate (AUGMENTIN) 875-125 MG tablet   Other Relevant Orders   Ambulatory referral to Gastroenterology   Fecal occult blood, imunochemical   GI Profile, Stool, PCR   CALPROTECTIN   Stool, WBC/Lactoferrin   CT ABDOMEN PELVIS WO CONTRAST   CBC w/Diff   Comp Met (CMET)   Lipase   Urinalysis w microscopic + reflex cultur   Other Visit  Diagnoses       Unintentional weight loss    -  Primary   Relevant Medications   amoxicillin-clavulanate (AUGMENTIN) 875-125 MG tablet   Other Relevant Orders   Ambulatory referral to Gastroenterology   Fecal occult blood, imunochemical   GI Profile, Stool, PCR   CALPROTECTIN   Stool, WBC/Lactoferrin   CT ABDOMEN PELVIS WO CONTRAST   CBC w/Diff   Comp Met (CMET)   Lipase   Urinalysis w microscopic + reflex cultur     Viral gastroenteritis       Relevant Medications   amoxicillin-clavulanate (AUGMENTIN) 875-125 MG tablet   Other Relevant Orders   Ambulatory referral to Gastroenterology   Fecal occult blood, imunochemical   GI Profile, Stool, PCR   CALPROTECTIN   Stool, WBC/Lactoferrin   CT ABDOMEN PELVIS WO CONTRAST   CBC w/Diff   Comp Met (CMET)   Lipase   Urinalysis w microscopic + reflex cultur     History of diverticulitis       Relevant Medications   amoxicillin-clavulanate (AUGMENTIN) 875-125 MG tablet   Other Relevant Orders   Ambulatory referral to Gastroenterology   Fecal occult blood, imunochemical   GI Profile, Stool, PCR   CALPROTECTIN   Stool, WBC/Lactoferrin   CT ABDOMEN PELVIS WO CONTRAST   CBC w/Diff   Comp Met (CMET)   Lipase   Urinalysis w microscopic + reflex cultur     Generalized abdominal pain       Relevant Medications   amoxicillin-clavulanate (AUGMENTIN) 875-125 MG tablet   Other Relevant Orders   Ambulatory referral to Gastroenterology   Fecal occult blood, imunochemical   GI Profile, Stool, PCR   CALPROTECTIN   Stool, WBC/Lactoferrin   CT ABDOMEN PELVIS WO CONTRAST   CBC w/Diff   Comp  Met (CMET)   Lipase   Urinalysis w microscopic + reflex cultur     Diarrhea of infectious origin       Relevant Medications   amoxicillin-clavulanate (AUGMENTIN) 875-125 MG tablet   Other Relevant Orders   Ambulatory referral to Gastroenterology   Fecal occult blood, imunochemical   GI Profile, Stool, PCR   CALPROTECTIN   Stool, WBC/Lactoferrin    CT ABDOMEN PELVIS WO CONTRAST   CBC w/Diff   Comp Met (CMET)   Lipase   Urinalysis w microscopic + reflex cultur       There are no discontinued medications.  No follow-ups on file.    Subjective:   Encounter date: 12/15/2023  Elizabeth Waller is a 53 y.o. 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   She  has a past medical history of Allergy, Endometriosis, Hypertension, and Sinus trouble..   Chief Complaint:  Abdominal cramping, diarrhea, and rectal bleeding. History of Present Illness:  Patient reports several weeks of ongoing diarrhea and abdominal cramping with intermittent rectal bleeding occurring yesterday and two weeks prior. Notes decreased frequency of bowel movements but persistent loose stools. Experiences significant weight loss, lack of appetite, and extreme fatigue, leading to prolonged periods of sleep. Describes episodes of feeling unusually hot and then cold without measured fever. Recent history of a mild cold exacerbating fatigue. Symptoms mirror a previous episode of diverticulitis from years prior. Has been consuming orange juice and small amounts of food (e.g., half a peanut butter and jelly sandwich) due to poor appetite. Taking Pepto-Bismol for symptom relief.  Review of Systems  Constitutional:  Positive for malaise/fatigue and weight loss (5 pound since last visit  6 days ago). Negative for chills, diaphoresis and fever.  HENT:  Negative for congestion, ear discharge, ear pain and hearing loss.   Eyes:  Negative for blurred vision, double vision, photophobia, pain, discharge and redness.  Respiratory:  Negative for cough, sputum production, shortness of breath and wheezing.   Cardiovascular:  Negative for chest pain and palpitations.  Gastrointestinal:  Positive for abdominal pain and blood in stool. Negative for constipation, diarrhea, heartburn, melena, nausea and vomiting.  Genitourinary:  Negative for dysuria, flank pain, frequency, hematuria and urgency.  Musculoskeletal:  Negative for myalgias.  Skin:  Negative for itching and rash.  Neurological:  Negative for dizziness, tingling, tremors, speech change, seizures, loss of consciousness, weakness and headaches.  Endo/Heme/Allergies:  Negative for polydipsia.  Psychiatric/Behavioral:  Negative for depression, hallucinations, memory loss, substance abuse and suicidal ideas. The patient does not have insomnia.   All other systems reviewed and are negative.   Past Surgical History:  Procedure Laterality Date   ABLATION     for scar tissue and endometriosis   CESAREAN SECTION     GASTRECTOMY     HAMMER TOE SURGERY Right    ORIF ANKLE FRACTURE Left    SINUS EXPLORATION     TUBAL LIGATION      Outpatient Medications Prior to Visit  Medication Sig Dispense Refill   losartan (COZAAR) 100 MG tablet Take 100 mg by mouth daily.  triamterene-hydrochlorothiazide (MAXZIDE-25) 37.5-25 MG tablet TAKE 1 TABLET BY MOUTH ONCE DAILY 90 tablet 3   bismuth subsalicylate (PEPTO-BISMOL) 262 MG chewable tablet Chew 2 tablets (524 mg total) by mouth 4 (four) times daily -  before meals and at bedtime. (Patient not taking: Reported on 12/15/2023)     cyanocobalamin (VITAMIN B12) 500 MCG tablet Take 500 mcg by mouth daily. (Patient not taking: Reported on 12/15/2023)     fluconazole (DIFLUCAN) 150 MG tablet Take 1  tablet (150 mg total) by mouth once a week. (Patient not taking: Reported on 12/15/2023) 4 tablet 0   ketoconazole (NIZORAL) 2 % cream Apply 1 Application topically daily. (Patient not taking: Reported on 12/15/2023) 15 g 0   Multiple Vitamin (MULTIVITAMIN) tablet Take 1 tablet by mouth daily. (Patient not taking: Reported on 12/15/2023)     progesterone (PROMETRIUM) 200 MG capsule TAKE 1 CAPSULE BY MOUTH EVERYDAY AT BEDTIME (Patient not taking: Reported on 12/15/2023)     valACYclovir (VALTREX) 1000 MG tablet ONE TABLET TWICE DAILY FOR 2 DAYS FOR "OUTBREAK" // OTHERWISE ONE TABLET DAILY (Patient not taking: Reported on 12/15/2023)     No facility-administered medications prior to visit.    Family History  Problem Relation Age of Onset   Cancer Mother        CLL   Hypertension Mother    Hypertension Father    CAD Father    Heart disease Maternal Grandfather     Social History   Socioeconomic History   Marital status: Married    Spouse name: Not on file   Number of children: 3   Years of education: Not on file   Highest education level: Some college, no degree  Occupational History   Occupation: Psychiatrist    Comment: Film/video editor  Tobacco Use   Smoking status: Never   Smokeless tobacco: Never  Vaping Use   Vaping status: Never Used  Substance and Sexual Activity   Alcohol use: No   Drug use: No   Sexual activity: Yes    Birth control/protection: Surgical  Other Topics Concern   Not on file  Social History Narrative   Not on file   Social Drivers of Health   Financial Resource Strain: Not on file  Food Insecurity: Not on file  Transportation Needs: Not on file  Physical Activity: Not on file  Stress: Not on file  Social Connections: Not on file  Intimate Partner Violence: Not on file                                                                                                  Objective:  Physical Exam: BP 110/76 (BP Location:  Right Arm, Patient Position: Sitting, Cuff Size: Normal)   Pulse 83   Temp 97.7 F (36.5 C)   Ht 5' 7.5" (1.715 m)   Wt 213 lb 6.4 oz (96.8 kg)   LMP 07/09/2023 (Approximate)   SpO2 99%   BMI 32.93 kg/m   Wt Readings from Last 3 Encounters:  12/15/23 213 lb 6.4 oz (96.8 kg)  12/09/23 218 lb 6.4  oz (99.1 kg)  01/22/23 201 lb 9.6 oz (91.4 kg)     Physical Exam Constitutional:      General: She is not in acute distress.    Appearance: Normal appearance. She is not ill-appearing or toxic-appearing.  HENT:     Head: Normocephalic and atraumatic.     Nose: Nose normal. No congestion.  Eyes:     General: No scleral icterus.    Extraocular Movements: Extraocular movements intact.  Cardiovascular:     Rate and Rhythm: Normal rate and regular rhythm.     Pulses: Normal pulses.     Heart sounds: Normal heart sounds.  Pulmonary:     Effort: Pulmonary effort is normal. No respiratory distress.     Breath sounds: Normal breath sounds.  Abdominal:     General: Abdomen is flat. Bowel sounds are normal.     Palpations: Abdomen is soft.     Tenderness: There is generalized abdominal tenderness (mild). There is no guarding or rebound.  Musculoskeletal:        General: Normal range of motion.  Lymphadenopathy:     Cervical: No cervical adenopathy.  Skin:    General: Skin is warm and dry.     Findings: No rash.  Neurological:     General: No focal deficit present.     Mental Status: She is alert and oriented to person, place, and time. Mental status is at baseline.  Psychiatric:        Mood and Affect: Mood normal.        Behavior: Behavior normal.        Thought Content: Thought content normal.        Judgment: Judgment normal.     No results found.  No results found for this or any previous visit (from the past 2160 hours).      Garner Nash, MD, MS

## 2023-12-15 NOTE — Addendum Note (Signed)
Addended by: Garnette Gunner on: 12/15/2023 04:42 PM   Modules accepted: Orders

## 2023-12-16 ENCOUNTER — Encounter: Payer: Self-pay | Admitting: Family Medicine

## 2023-12-16 DIAGNOSIS — R634 Abnormal weight loss: Secondary | ICD-10-CM | POA: Diagnosis not present

## 2023-12-16 DIAGNOSIS — A09 Infectious gastroenteritis and colitis, unspecified: Secondary | ICD-10-CM | POA: Diagnosis not present

## 2023-12-16 DIAGNOSIS — A084 Viral intestinal infection, unspecified: Secondary | ICD-10-CM | POA: Diagnosis not present

## 2023-12-16 DIAGNOSIS — K921 Melena: Secondary | ICD-10-CM | POA: Diagnosis not present

## 2023-12-16 DIAGNOSIS — R1084 Generalized abdominal pain: Secondary | ICD-10-CM | POA: Diagnosis not present

## 2023-12-16 DIAGNOSIS — Z8719 Personal history of other diseases of the digestive system: Secondary | ICD-10-CM | POA: Diagnosis not present

## 2023-12-16 NOTE — Addendum Note (Signed)
Addended by: Garnette Gunner on: 12/16/2023 08:31 AM   Modules accepted: Orders

## 2023-12-17 LAB — FECAL LACTOFERRIN, QUANT
Fecal Lactoferrin: POSITIVE — AB
MICRO NUMBER:: 15984179
SPECIMEN QUALITY:: ADEQUATE

## 2023-12-18 ENCOUNTER — Telehealth: Payer: Self-pay | Admitting: Family Medicine

## 2023-12-18 NOTE — Telephone Encounter (Signed)
Elizabeth Waller, are you able to reroute the existing referral?   Copied from CRM [id number]. Topic: Referral - Request for Referral >> Dec 18, 2023  2:20 PM Elizabeth Waller wrote: Did the patient discuss referral with their provider in the last year? Yes (If No - schedule appointment) (If Yes - send message)  Appointment offered? Yes  Type of order/referral and detailed reason for visit: Gastroenterology  Preference of office, provider, location: [facility]  [address] , [place] [address] [phone]    Send Referral to  - Briscoe Deutscher [phone]  If referral order, have you been seen by this specialty before? No (If Yes, this issue or another issue? When? Where?  Can we respond through MyChart? Yes

## 2023-12-19 LAB — GI PROFILE, STOOL, PCR

## 2023-12-21 LAB — URINALYSIS W MICROSCOPIC + REFLEX CULTURE
Bacteria, UA: NONE SEEN /[HPF]
Bilirubin Urine: NEGATIVE
Glucose, UA: NEGATIVE
Hgb urine dipstick: NEGATIVE
Ketones, ur: NEGATIVE
Leukocyte Esterase: NEGATIVE
Nitrites, Initial: NEGATIVE
Protein, ur: NEGATIVE
RBC / HPF: NONE SEEN /[HPF] (ref 0–2)
Specific Gravity, Urine: 1.01 (ref 1.001–1.035)
pH: 8 (ref 5.0–8.0)

## 2023-12-21 LAB — FECAL LACTOFERRIN, QUANT

## 2023-12-21 LAB — NO CULTURE INDICATED

## 2023-12-22 LAB — CALPROTECTIN: Calprotectin: 13 ug/g

## 2023-12-30 DIAGNOSIS — Z1211 Encounter for screening for malignant neoplasm of colon: Secondary | ICD-10-CM | POA: Diagnosis not present

## 2023-12-30 DIAGNOSIS — K529 Noninfective gastroenteritis and colitis, unspecified: Secondary | ICD-10-CM | POA: Diagnosis not present

## 2024-01-11 DIAGNOSIS — K635 Polyp of colon: Secondary | ICD-10-CM | POA: Diagnosis not present

## 2024-01-11 DIAGNOSIS — Z1211 Encounter for screening for malignant neoplasm of colon: Secondary | ICD-10-CM | POA: Diagnosis not present

## 2024-01-11 LAB — HM COLONOSCOPY

## 2024-01-13 DIAGNOSIS — K635 Polyp of colon: Secondary | ICD-10-CM | POA: Diagnosis not present

## 2024-01-19 DIAGNOSIS — D374 Neoplasm of uncertain behavior of colon: Secondary | ICD-10-CM | POA: Diagnosis not present

## 2024-01-28 ENCOUNTER — Telehealth: Payer: Self-pay

## 2024-01-28 NOTE — Telephone Encounter (Signed)
 Copied from CRM [id number]. Topic: Clinical - Medical Advice >> Jan 28, 2024 11:42 AM Lennart Pall wrote: Reason for CRM: Patient is going to have surgery to remove the mass from her colon at [facility]  within the [facility] and it is scheduled in June. She is looking for somewhere else to have the surgery sooner. Please reach out to patient as soon as possible to talk about other options

## 2024-01-28 NOTE — Telephone Encounter (Signed)
Left VM to rtn call. Dm/cma       

## 2024-02-11 DIAGNOSIS — K51419 Inflammatory polyps of colon with unspecified complications: Secondary | ICD-10-CM | POA: Diagnosis not present

## 2024-02-11 DIAGNOSIS — D125 Benign neoplasm of sigmoid colon: Secondary | ICD-10-CM | POA: Diagnosis not present

## 2024-02-25 DIAGNOSIS — R635 Abnormal weight gain: Secondary | ICD-10-CM | POA: Diagnosis not present

## 2024-02-25 DIAGNOSIS — E063 Autoimmune thyroiditis: Secondary | ICD-10-CM | POA: Diagnosis not present

## 2024-02-25 DIAGNOSIS — Z131 Encounter for screening for diabetes mellitus: Secondary | ICD-10-CM | POA: Diagnosis not present

## 2024-02-25 DIAGNOSIS — N951 Menopausal and female climacteric states: Secondary | ICD-10-CM | POA: Diagnosis not present

## 2024-02-25 DIAGNOSIS — E559 Vitamin D deficiency, unspecified: Secondary | ICD-10-CM | POA: Diagnosis not present

## 2024-02-25 DIAGNOSIS — E7849 Other hyperlipidemia: Secondary | ICD-10-CM | POA: Diagnosis not present

## 2024-02-25 DIAGNOSIS — E611 Iron deficiency: Secondary | ICD-10-CM | POA: Diagnosis not present

## 2024-02-29 DIAGNOSIS — Z1331 Encounter for screening for depression: Secondary | ICD-10-CM | POA: Diagnosis not present

## 2024-02-29 DIAGNOSIS — N951 Menopausal and female climacteric states: Secondary | ICD-10-CM | POA: Diagnosis not present

## 2024-02-29 DIAGNOSIS — E7849 Other hyperlipidemia: Secondary | ICD-10-CM | POA: Diagnosis not present

## 2024-02-29 DIAGNOSIS — E669 Obesity, unspecified: Secondary | ICD-10-CM | POA: Diagnosis not present

## 2024-02-29 DIAGNOSIS — E559 Vitamin D deficiency, unspecified: Secondary | ICD-10-CM | POA: Diagnosis not present

## 2024-03-14 DIAGNOSIS — Z6833 Body mass index (BMI) 33.0-33.9, adult: Secondary | ICD-10-CM | POA: Diagnosis not present

## 2024-03-14 DIAGNOSIS — I10 Essential (primary) hypertension: Secondary | ICD-10-CM | POA: Diagnosis not present

## 2024-03-22 DIAGNOSIS — Z6833 Body mass index (BMI) 33.0-33.9, adult: Secondary | ICD-10-CM | POA: Diagnosis not present

## 2024-03-22 DIAGNOSIS — E7849 Other hyperlipidemia: Secondary | ICD-10-CM | POA: Diagnosis not present

## 2024-03-31 DIAGNOSIS — Z6832 Body mass index (BMI) 32.0-32.9, adult: Secondary | ICD-10-CM | POA: Diagnosis not present

## 2024-03-31 DIAGNOSIS — E559 Vitamin D deficiency, unspecified: Secondary | ICD-10-CM | POA: Diagnosis not present

## 2024-04-07 DIAGNOSIS — E063 Autoimmune thyroiditis: Secondary | ICD-10-CM | POA: Diagnosis not present

## 2024-04-07 DIAGNOSIS — Z6832 Body mass index (BMI) 32.0-32.9, adult: Secondary | ICD-10-CM | POA: Diagnosis not present

## 2024-04-07 DIAGNOSIS — I10 Essential (primary) hypertension: Secondary | ICD-10-CM | POA: Diagnosis not present

## 2024-04-19 DIAGNOSIS — Z6832 Body mass index (BMI) 32.0-32.9, adult: Secondary | ICD-10-CM | POA: Diagnosis not present

## 2024-04-19 DIAGNOSIS — N951 Menopausal and female climacteric states: Secondary | ICD-10-CM | POA: Diagnosis not present

## 2024-04-19 DIAGNOSIS — E7849 Other hyperlipidemia: Secondary | ICD-10-CM | POA: Diagnosis not present

## 2024-04-19 DIAGNOSIS — E063 Autoimmune thyroiditis: Secondary | ICD-10-CM | POA: Diagnosis not present

## 2024-04-26 DIAGNOSIS — E559 Vitamin D deficiency, unspecified: Secondary | ICD-10-CM | POA: Diagnosis not present

## 2024-04-26 DIAGNOSIS — Z6832 Body mass index (BMI) 32.0-32.9, adult: Secondary | ICD-10-CM | POA: Diagnosis not present

## 2024-04-26 DIAGNOSIS — N951 Menopausal and female climacteric states: Secondary | ICD-10-CM | POA: Diagnosis not present

## 2024-04-26 DIAGNOSIS — E611 Iron deficiency: Secondary | ICD-10-CM | POA: Diagnosis not present

## 2024-05-03 DIAGNOSIS — I10 Essential (primary) hypertension: Secondary | ICD-10-CM | POA: Diagnosis not present

## 2024-05-03 DIAGNOSIS — Z6832 Body mass index (BMI) 32.0-32.9, adult: Secondary | ICD-10-CM | POA: Diagnosis not present

## 2024-05-10 DIAGNOSIS — Z6831 Body mass index (BMI) 31.0-31.9, adult: Secondary | ICD-10-CM | POA: Diagnosis not present

## 2024-05-10 DIAGNOSIS — I10 Essential (primary) hypertension: Secondary | ICD-10-CM | POA: Diagnosis not present

## 2024-05-24 DIAGNOSIS — E559 Vitamin D deficiency, unspecified: Secondary | ICD-10-CM | POA: Diagnosis not present

## 2024-06-13 DIAGNOSIS — M79671 Pain in right foot: Secondary | ICD-10-CM | POA: Diagnosis not present

## 2024-06-13 DIAGNOSIS — M25571 Pain in right ankle and joints of right foot: Secondary | ICD-10-CM | POA: Diagnosis not present

## 2024-07-14 ENCOUNTER — Other Ambulatory Visit: Payer: Self-pay | Admitting: Family Medicine

## 2024-07-14 NOTE — Telephone Encounter (Signed)
 Left detailed VM that appt needed. Dm/cma

## 2024-07-19 NOTE — Telephone Encounter (Signed)
 Left detailed VM to rt call to schedule a f/u appointment.  Dm/cma

## 2024-07-28 NOTE — Telephone Encounter (Signed)
 Left detailed VM to rt call to schedule a f/u appointment.  Dm/cma

## 2024-08-01 ENCOUNTER — Encounter: Payer: Self-pay | Admitting: Family Medicine

## 2024-08-01 NOTE — Telephone Encounter (Signed)
 Letter printed due to being unable to reach by phone. Dm/cma

## 2024-08-10 ENCOUNTER — Other Ambulatory Visit: Payer: Self-pay | Admitting: Family Medicine

## 2024-08-10 NOTE — Telephone Encounter (Signed)
 Left VM to rtn call to make an appointment due to its been 14 months since she has been in. Dm/cma

## 2024-08-14 ENCOUNTER — Other Ambulatory Visit: Payer: Self-pay | Admitting: Family Medicine

## 2024-10-04 ENCOUNTER — Emergency Department (HOSPITAL_BASED_OUTPATIENT_CLINIC_OR_DEPARTMENT_OTHER)

## 2024-10-04 ENCOUNTER — Emergency Department (HOSPITAL_BASED_OUTPATIENT_CLINIC_OR_DEPARTMENT_OTHER)
Admission: EM | Admit: 2024-10-04 | Discharge: 2024-10-04 | Disposition: A | Discharge status: Home / Self Care | Attending: Emergency Medicine | Admitting: Emergency Medicine

## 2024-10-04 ENCOUNTER — Encounter (HOSPITAL_BASED_OUTPATIENT_CLINIC_OR_DEPARTMENT_OTHER): Payer: Self-pay

## 2024-10-04 ENCOUNTER — Other Ambulatory Visit: Payer: Self-pay

## 2024-10-04 DIAGNOSIS — I10 Essential (primary) hypertension: Secondary | ICD-10-CM | POA: Insufficient documentation

## 2024-10-04 DIAGNOSIS — J019 Acute sinusitis, unspecified: Secondary | ICD-10-CM | POA: Diagnosis not present

## 2024-10-04 DIAGNOSIS — Z79899 Other long term (current) drug therapy: Secondary | ICD-10-CM | POA: Insufficient documentation

## 2024-10-04 DIAGNOSIS — R220 Localized swelling, mass and lump, head: Secondary | ICD-10-CM | POA: Diagnosis not present

## 2024-10-04 MED ORDER — AMOXICILLIN-POT CLAVULANATE 875-125 MG PO TABS
1.0000 | ORAL_TABLET | Freq: Once | ORAL | Status: AC
  Administered 2024-10-04: 1 via ORAL
  Filled 2024-10-04: qty 1

## 2024-10-04 MED ORDER — AMOXICILLIN-POT CLAVULANATE 875-125 MG PO TABS: 1.0000 | ORAL_TABLET | Freq: Two times a day (BID) | ORAL | 0 refills | Status: AC

## 2024-10-04 NOTE — ED Triage Notes (Signed)
 Reports swelling of R side of face swelling since this afternoon. States is currently sick w URI symptoms. Denies difficulty eating, drinking, breathing.

## 2024-10-04 NOTE — ED Provider Notes (Signed)
 [place] [facility]PARTMENT AT [facility] Provider Note   CSN: [id number] Arrival date & time: 10/04/24  1839     Patient presents with: Facial Swelling   Tacara Hadlock is a 53 y.o. female.  Past medical history including chronic maxillary sinusitis with prior polypectomy, hypertension presenting with new right sided facial swelling.  She reports that she was sick for 10 days with upper respiratory symptoms that had started to get better when on Sunday she started feeling worse again.  She reports that she stayed home from work today and while eating dinner noticed that soreness on the right side of her face she reached up and felt the side where she felt swelling.  She has not had any trouble breathing or swallowing, and denies that this has ever happened before.  She has no documented fevers, but does endorse cough, nasal congestion, fatigue.        Prior to Admission medications   Medication Sig Start Date End Date Taking? Authorizing Provider  amoxicillin -clavulanate (AUGMENTIN ) 875-125 MG tablet Take 1 tablet by mouth 2 (two) times daily. 12/15/23   Sebastian Beverley NOVAK, MD  bismuth  subsalicylate (PEPTO-BISMOL) 262 MG chewable tablet Chew 2 tablets (524 mg total) by mouth 4 (four) times daily -  before meals and at bedtime. Patient not taking: Reported on 12/15/2023 12/09/23   Nche, Roselie Rockford, NP  cyanocobalamin (VITAMIN B12) 500 MCG tablet Take 500 mcg by mouth daily. Patient not taking: Reported on 12/15/2023    [provider]  fluconazole  (DIFLUCAN ) 150 MG tablet Take 1 tablet (150 mg total) by mouth once a week. Patient not taking: Reported on 12/15/2023 12/09/23   Nche, Charlotte Lum, NP  ketoconazole  (NIZORAL ) 2 % cream Apply 1 Application topically daily. Patient not taking: Reported on 12/15/2023 12/09/23   Nche, Roselie Rockford, NP  losartan (COZAAR) 100 MG tablet Take 100 mg by mouth daily. 01/19/23   [provider]  Multiple Vitamin  (MULTIVITAMIN) tablet Take 1 tablet by mouth daily. Patient not taking: Reported on 12/15/2023    [provider]  progesterone  (PROMETRIUM ) 200 MG capsule TAKE 1 CAPSULE BY MOUTH EVERYDAY AT BEDTIME Patient not taking: Reported on 12/15/2023 08/07/23   [provider]  triamterene -hydrochlorothiazide (MAXZIDE-25) 37.5-25 MG tablet TAKE 1 TABLET BY MOUTH EVERY DAY 07/14/24   Thedora Garnette HERO, MD  valACYclovir (VALTREX) 1000 MG tablet ONE TABLET TWICE DAILY FOR 2 DAYS FOR OUTBREAK // OTHERWISE ONE TABLET DAILY Patient not taking: Reported on 12/15/2023    [provider]    Allergies: Patient has no active allergies.    Review of Systems  Updated Vital Signs BP (!) 185/102 (BP Location: Right Arm)   Pulse 75   Temp 98.6 F (37 C) (Oral)   Resp 18   SpO2 100%   Physical Exam Constitutional:      Appearance: Normal appearance.  HENT:     Right Ear: Ear canal and external ear normal. A middle ear effusion is present. Tympanic membrane is not erythematous, retracted or bulging.     Left Ear: Tympanic membrane, ear canal and external ear normal.     Nose: Congestion present. No rhinorrhea.     Mouth/Throat:     Mouth: Mucous membranes are moist.     Pharynx: Oropharynx is clear.  Eyes:     Extraocular Movements: Extraocular movements intact.     Pupils: Pupils are equal, round, and reactive to light.  Cardiovascular:     Rate and Rhythm:  Normal rate and regular rhythm.     Pulses: Normal pulses.  Pulmonary:     Effort: Pulmonary effort is normal.     Breath sounds: Examination of the right-upper field reveals decreased breath sounds. Decreased breath sounds present. No wheezing, rhonchi or rales.  Musculoskeletal:     Cervical back: Normal range of motion and neck supple.  Lymphadenopathy:     Cervical: Cervical adenopathy present.  Neurological:     Mental Status: She is alert.     (all labs ordered are listed, but only abnormal results are  displayed) Labs Reviewed - No data to display  EKG: None  Radiology: No results found.   Procedures   Medications Ordered in the ED - No data to display                                  Medical Decision Making This is a 53 year old patient with a recent history of URI that began to resolve and also got worse.  Given this and her history of chronic maxillary sinusitis, will obtain chest x-ray to rule out pneumonia as a cause for her symptoms.  At this time her facial swelling is most consistent with adult and enlarged anterior cervical lymph node as it is mobile, rubbery, and minimally tender.  This would be consistent with her upper respiratory symptoms and significant congestion.  There is also concurrent clear fluid behind the eardrum on the right without any bulging or erythema.  Chest x-ray is negative for pneumonia or other cardiopulmonary process.  Given improvement followed by recurrence of symptoms, will treat with 7-day course of Augmentin .  Recommend warm compress lymph node.  Patient may follow-up with PCP as needed  Amount and/or Complexity of Data Reviewed Radiology: ordered.  Risk Prescription drug management.     Final diagnoses:  None    ED Discharge Orders     None          Cleotilde Lukes, DO 10/04/24 2101    Darra Fonda MATSU, MD 10/11/24 [phone]
